# Patient Record
Sex: Male | Born: 1955 | Race: White | Hispanic: No | Marital: Married | Smoking: Never smoker
Health system: Southern US, Community
[De-identification: ages and names within clinical notes are randomized; demographics above are authoritative.]

## PROBLEM LIST (undated history)

## (undated) DIAGNOSIS — M25562 Pain in left knee: Secondary | ICD-10-CM

## (undated) DIAGNOSIS — M1990 Unspecified osteoarthritis, unspecified site: Secondary | ICD-10-CM

## (undated) DIAGNOSIS — R972 Elevated prostate specific antigen [PSA]: Secondary | ICD-10-CM

## (undated) HISTORY — PX: COLON SURGERY: SHX602

---

## 2016-01-23 ENCOUNTER — Ambulatory Visit
Admission: RE | Admit: 2016-01-23 | Discharge: 2016-01-23 | Disposition: A | Discharge status: Home / Self Care | Payer: Commercial Managed Care - HMO | Source: Ambulatory Visit | Attending: Family Medicine | Admitting: Family Medicine

## 2016-01-23 ENCOUNTER — Other Ambulatory Visit: Payer: Self-pay | Admitting: Family Medicine

## 2016-01-23 DIAGNOSIS — R52 Pain, unspecified: Secondary | ICD-10-CM

## 2017-07-30 ENCOUNTER — Encounter (HOSPITAL_BASED_OUTPATIENT_CLINIC_OR_DEPARTMENT_OTHER): Payer: Self-pay | Admitting: *Deleted

## 2017-07-30 ENCOUNTER — Other Ambulatory Visit: Payer: Self-pay | Admitting: Urology

## 2017-07-30 NOTE — Progress Notes (Signed)
NPO AFTER MN.  ARRIVE AT 0915.  NEEDS HG.  MAY TAKE TYLENOL IF NEEDED AM DOS W/ SIPS OF WATER.  WILL DO FLEET ENEMA AM DOS.

## 2017-08-02 ENCOUNTER — Other Ambulatory Visit: Payer: Self-pay | Admitting: Urology

## 2017-08-02 ENCOUNTER — Other Ambulatory Visit (HOSPITAL_COMMUNITY): Payer: Self-pay | Admitting: Urology

## 2017-08-02 DIAGNOSIS — R972 Elevated prostate specific antigen [PSA]: Secondary | ICD-10-CM

## 2017-08-02 DIAGNOSIS — C61 Malignant neoplasm of prostate: Secondary | ICD-10-CM

## 2017-08-03 MED ORDER — LACTATED RINGERS IV SOLN
INTRAVENOUS | Status: DC
  Filled 2017-08-03: qty 1000

## 2017-08-03 NOTE — H&P (Signed)
Urology Preoperative H&P   Chief Complain: elevated PSA and prostate nodule  History of Present Illness: Brett Lamb is a 61 y.o. male with an elevated PSA value of 5.2 as well as a prostate nodule on digital rectal exam.  He also has a prominent family history of prostate cancer diagnosed in his father.  An attempt to undergo a prostate biopsy was made in the office, but the patient became diaphoretic and had a near-syncopal episode after starting the prostate biopsy.  He denies a history of voiding or storage urinary symptoms, hematuria, UTIs, STDs, urolithiasis, GU malignancy/trauma/surgery.  Past Medical History:  Diagnosis Date  . Arthritis    feet  . Elevated PSA   . Left knee pain    injury     Past Surgical History:  Procedure Laterality Date  . COLON SURGERY  age 33 months   repair intestinal blockage    Allergies: No Known Allergies  History reviewed. No pertinent family history.  Social History:  reports that he has never smoked. He has never used smokeless tobacco. He reports that he does not drink alcohol or use drugs.  ROS: A complete review of systems was performed.  All systems are negative except for pertinent findings as noted.  Physical Exam:  Vital signs in last 24 hours:   Constitutional:  Alert and oriented, No acute distress Cardiovascular: Regular rate and rhythm, No JVD Respiratory: Normal respiratory effort, Lungs clear bilaterally GI: Abdomen is soft, nontender, nondistended, no abdominal masses GU: No CVA tenderness Lymphatic: No lymphadenopathy Neurologic: Grossly intact, no focal deficits Psychiatric: Normal mood and affect  Laboratory Data:  No results for input(s): WBC, HGB, HCT, PLT in the last 72 hours.  No results for input(s): NA, K, CL, GLUCOSE, BUN, CALCIUM, CREATININE in the last 72 hours.  Invalid input(s): CO3   No results found for this or any previous visit (from the past 24 hour(s)). No results found for this or any  previous visit (from the past 240 hour(s)).  Renal Function: No results for input(s): CREATININE in the last 168 hours. CrCl cannot be calculated (No order found.).  Radiologic Imaging: No results found.  I independently reviewed the above imaging studies.  Assessment and Plan Brett Lamb is a 61 y.o. male with an elevated PSA and a prostate nodule on digital rectal exam.  -The risks, benefits and alternatives of transrectal ultrasound guided prostate biopsy was discussed with the patient.  He voices understanding and wishes to proceed.    Conception Oms Vivien Barretto 08/03/2017, 8:25 AM    Annitta Jersey, MD

## 2017-08-04 ENCOUNTER — Encounter (HOSPITAL_BASED_OUTPATIENT_CLINIC_OR_DEPARTMENT_OTHER)
Admission: RE | Disposition: A | Discharge status: Home / Self Care | Payer: Self-pay | Source: Ambulatory Visit | Attending: Urology

## 2017-08-04 ENCOUNTER — Ambulatory Visit (HOSPITAL_COMMUNITY)
Admission: RE | Admit: 2017-08-04 | Discharge: 2017-08-04 | Disposition: A | Discharge status: Home / Self Care | Payer: 59 | Source: Ambulatory Visit | Attending: Urology | Admitting: Urology

## 2017-08-04 ENCOUNTER — Ambulatory Visit (HOSPITAL_BASED_OUTPATIENT_CLINIC_OR_DEPARTMENT_OTHER): Payer: 59 | Admitting: Anesthesiology

## 2017-08-04 ENCOUNTER — Ambulatory Visit (HOSPITAL_BASED_OUTPATIENT_CLINIC_OR_DEPARTMENT_OTHER)
Admission: RE | Admit: 2017-08-04 | Discharge: 2017-08-04 | Disposition: A | Discharge status: Home / Self Care | Payer: 59 | Source: Ambulatory Visit | Attending: Urology | Admitting: Urology

## 2017-08-04 ENCOUNTER — Encounter (HOSPITAL_BASED_OUTPATIENT_CLINIC_OR_DEPARTMENT_OTHER): Payer: Self-pay | Admitting: *Deleted

## 2017-08-04 DIAGNOSIS — Z125 Encounter for screening for malignant neoplasm of prostate: Secondary | ICD-10-CM

## 2017-08-04 DIAGNOSIS — Z8042 Family history of malignant neoplasm of prostate: Secondary | ICD-10-CM | POA: Insufficient documentation

## 2017-08-04 DIAGNOSIS — C61 Malignant neoplasm of prostate: Secondary | ICD-10-CM

## 2017-08-04 DIAGNOSIS — N402 Nodular prostate without lower urinary tract symptoms: Secondary | ICD-10-CM | POA: Diagnosis present

## 2017-08-04 HISTORY — DX: Elevated prostate specific antigen (PSA): R97.20

## 2017-08-04 HISTORY — DX: Pain in left knee: M25.562

## 2017-08-04 HISTORY — DX: Unspecified osteoarthritis, unspecified site: M19.90

## 2017-08-04 HISTORY — PX: PROSTATE BIOPSY: SHX241

## 2017-08-04 LAB — POCT I-STAT, CHEM 8
BUN: 14 mg/dL (ref 6–20)
CALCIUM ION: 1.21 mmol/L (ref 1.15–1.40)
CHLORIDE: 104 mmol/L (ref 101–111)
Creatinine, Ser: 1 mg/dL (ref 0.61–1.24)
Glucose, Bld: 101 mg/dL — ABNORMAL HIGH (ref 65–99)
HCT: 47 % (ref 39.0–52.0)
Hemoglobin: 16 g/dL (ref 13.0–17.0)
POTASSIUM: 3.7 mmol/L (ref 3.5–5.1)
SODIUM: 142 mmol/L (ref 135–145)
TCO2: 24 mmol/L (ref 22–32)

## 2017-08-04 SURGERY — BIOPSY, PROSTATE
Anesthesia: Monitor Anesthesia Care | Site: Prostate

## 2017-08-04 MED ORDER — PROPOFOL 500 MG/50ML IV EMUL
INTRAVENOUS | Status: DC | PRN
  Administered 2017-08-04: 200 ug/kg/min via INTRAVENOUS

## 2017-08-04 MED ORDER — MIDAZOLAM HCL 2 MG/2ML IJ SOLN
INTRAMUSCULAR | Status: AC
  Filled 2017-08-04: qty 2

## 2017-08-04 MED ORDER — SODIUM CHLORIDE 0.9 % IV SOLN
INTRAVENOUS | Status: DC
  Filled 2017-08-04: qty 1000

## 2017-08-04 MED ORDER — OXYCODONE HCL 5 MG PO TABS
5.0000 mg | ORAL_TABLET | Freq: Once | ORAL | Status: DC | PRN
  Filled 2017-08-04: qty 1

## 2017-08-04 MED ORDER — DEXTROSE 5 % IV SOLN
2.0000 g | Freq: Once | INTRAVENOUS | Status: DC
  Filled 2017-08-04: qty 2

## 2017-08-04 MED ORDER — PROPOFOL 10 MG/ML IV BOLUS
INTRAVENOUS | Status: AC
  Filled 2017-08-04: qty 20

## 2017-08-04 MED ORDER — MIDAZOLAM HCL 5 MG/5ML IJ SOLN
INTRAMUSCULAR | Status: DC | PRN
  Administered 2017-08-04: 2 mg via INTRAVENOUS

## 2017-08-04 MED ORDER — GENTAMICIN SULFATE 40 MG/ML IJ SOLN: 5.0000 mg/kg | INTRAVENOUS | Status: DC

## 2017-08-04 MED ORDER — CEFTRIAXONE SODIUM 2 G IJ SOLR
INTRAMUSCULAR | Status: AC
  Filled 2017-08-04: qty 2

## 2017-08-04 MED ORDER — SODIUM CHLORIDE 0.9 % IV SOLN
INTRAVENOUS | Status: DC
  Administered 2017-08-04: 13:00:00 via INTRAVENOUS
  Filled 2017-08-04: qty 1000

## 2017-08-04 MED ORDER — LIDOCAINE 2% (20 MG/ML) 5 ML SYRINGE
INTRAMUSCULAR | Status: DC | PRN
  Administered 2017-08-04: 50 mg via INTRAVENOUS

## 2017-08-04 MED ORDER — OXYCODONE HCL 5 MG/5ML PO SOLN
5.0000 mg | Freq: Once | ORAL | Status: DC | PRN
  Filled 2017-08-04: qty 5

## 2017-08-04 MED ORDER — PROMETHAZINE HCL 25 MG/ML IJ SOLN
6.2500 mg | INTRAMUSCULAR | Status: DC | PRN
  Filled 2017-08-04: qty 1

## 2017-08-04 MED ORDER — FENTANYL CITRATE (PF) 100 MCG/2ML IJ SOLN
25.0000 ug | INTRAMUSCULAR | Status: DC | PRN
  Filled 2017-08-04: qty 1

## 2017-08-04 MED ORDER — DEXTROSE 5 % IV SOLN
INTRAVENOUS | Status: AC
  Filled 2017-08-04: qty 50

## 2017-08-04 MED ORDER — GENTAMICIN SULFATE 40 MG/ML IJ SOLN
5.0000 mg/kg | INTRAVENOUS | Status: AC
  Administered 2017-08-04: 450 mg via INTRAVENOUS
  Filled 2017-08-04: qty 11.25

## 2017-08-04 MED ORDER — MEPERIDINE HCL 25 MG/ML IJ SOLN
6.2500 mg | INTRAMUSCULAR | Status: DC | PRN
  Filled 2017-08-04: qty 1

## 2017-08-04 MED ORDER — FENTANYL CITRATE (PF) 100 MCG/2ML IJ SOLN
INTRAMUSCULAR | Status: DC | PRN
  Administered 2017-08-04: 50 ug via INTRAVENOUS

## 2017-08-04 MED ORDER — FENTANYL CITRATE (PF) 100 MCG/2ML IJ SOLN
INTRAMUSCULAR | Status: AC
  Filled 2017-08-04: qty 2

## 2017-08-04 SURGICAL SUPPLY — 12 items
CLOTH BEACON ORANGE TIMEOUT ST (SAFETY) ×2 IMPLANT
INST BIOPSY MAXCORE 18GX25 (NEEDLE) ×1 IMPLANT
INSTR BIOPSY MAXCORE 18GX20 (NEEDLE) IMPLANT
KIT RM TURNOVER CYSTO AR (KITS) ×2 IMPLANT
NDL SAFETY ECLIPSE 18X1.5 (NEEDLE) IMPLANT
NDL SPNL 22GX7 QUINCKE BK (NEEDLE) ×1 IMPLANT
NEEDLE HYPO 18GX1.5 SHARP (NEEDLE)
NEEDLE SPNL 22GX7 QUINCKE BK (NEEDLE) ×2 IMPLANT
NS IRRIG 500ML POUR BTL (IV SOLUTION) ×2 IMPLANT
PAD PREP 24X48 CUFFED NSTRL (MISCELLANEOUS) ×2 IMPLANT
SYR CONTROL 10ML LL (SYRINGE) IMPLANT
UNDERPAD 30X30 INCONTINENT (UNDERPADS AND DIAPERS) ×4 IMPLANT

## 2017-08-04 NOTE — Progress Notes (Signed)
Patient ate breakfast..  Surgery cancelled to be reschedule

## 2017-08-04 NOTE — Interval H&P Note (Signed)
History and Physical Interval Note:  08/04/2017 2:11 PM  Brett Lamb  has presented today for surgery, with the diagnosis of ELEVATED PROSTATE- SPECIFIC ANTIGEN  The various methods of treatment have been discussed with the patient and family. After consideration of risks, benefits and other options for treatment, the patient has consented to  Procedure(s) with comments: PROSTATE BIOPSY (N/A) - NEEDS 30 MIN as a surgical intervention .  The patient's history has been reviewed, patient examined, no change in status, stable for surgery.  I have reviewed the patient's chart and labs.  Questions were answered to the patient's satisfaction.     [name]

## 2017-08-04 NOTE — Anesthesia Procedure Notes (Signed)
Procedure Name: MAC Date/Time: 08/04/2017 2:20 PM Performed by: Wanita Chamberlain Pre-anesthesia Checklist: Patient identified, Timeout performed, Emergency Drugs available, Suction available and Patient being monitored Patient Re-evaluated:Patient Re-evaluated prior to induction Oxygen Delivery Method: Simple face mask Preoxygenation: Pre-oxygenation with 100% oxygen Induction Type: IV induction Placement Confirmation: positive ETCO2 and CO2 detector Dental Injury: Teeth and Oropharynx as per pre-operative assessment

## 2017-08-04 NOTE — Op Note (Signed)
Preoperative diagnosis:  1. Elevated PSA with prostate nodule  Postoperative diagnosis: 1. Elevated PSA with prostate nodule  Procedure(s): 1. Transrectal ultrasound-guided prostate biopsy  Surgeon: Brett Hughs, MD  Anesthesia:  Local/MAC  Complications: none  EBL: less than 5 mL  Specimens: 1. 2 individual prostate biopsies from each sextant  Disposition of specimens: pathology  Intraoperative findings: prostate volume = 29 g  Indication: Brett Lamb is a 61 year old male he was recently found to have an elevated PSA value of 5.2 ng/dL and a prostate nodule on physical exam.  He has been consented for the above procedures, voices understanding and wishes to proceed.  Description of procedure:  After informed consent was obtained, the patient was brought to the operating room and local/Mac anesthesia was administered.  A time out was then performed.  The ultrasound probe was then inserted into the rectum and visualization of the prostate was performed.  Images of the prostate base, mid-prostate and prostate apex were then obtained.  The prostate volume was measured at approximately 29 g.  We then obtained lateral and medial biopsies from each sextant of the prostate for a total of 12 biopsies.  The ultrasound probe was then removed and the specimens were sent to pathology for analysis.  The patient tolerated the procedure well and was transferred to the postanesthesia unit in stable condition.  Plan: Follow up with Dr. Noah Delaine to discuss pathology results in the next 7-10 days.

## 2017-08-04 NOTE — Transfer of Care (Signed)
Immediate Anesthesia Transfer of Care Note  Patient: Brett Lamb  Procedure(s) Performed: Procedure(s) with comments: PROSTATE BIOPSY (N/A) - NEEDS 30 MIN  Patient Location: PACU  Anesthesia Type:MAC  Level of Consciousness: awake, alert , oriented and patient cooperative  Airway & Oxygen Therapy: Patient Spontanous Breathing and Patient connected to face mask oxygen  Post-op Assessment: Report given to RN and Post -op Vital signs reviewed and stable  Post vital signs: Reviewed and stable  Last Vitals:  Vitals:   08/04/17 0856 08/04/17 1220  BP: (!) 165/77 (!) 158/76  Pulse: 69 63  Resp: 16 18  Temp: 36.9 C 36.9 C  SpO2: 99% 99%    Last Pain:  Vitals:   08/04/17 1220  TempSrc: Oral  PainSc:       Patients Stated Pain Goal: 8 (62/[id number])  Complications: No apparent anesthesia complications

## 2017-08-04 NOTE — Anesthesia Preprocedure Evaluation (Signed)
Anesthesia Evaluation  Patient identified by MRN, date of birth, ID band Patient awake    Reviewed: Allergy & Precautions, NPO status , Patient's Chart, lab work & pertinent test results  Airway Mallampati: II  TM Distance: >3 FB Neck ROM: Full    Dental no notable dental hx.    Pulmonary neg pulmonary ROS,    Pulmonary exam normal breath sounds clear to auscultation       Cardiovascular negative cardio ROS Normal cardiovascular exam Rhythm:Regular Rate:Normal     Neuro/Psych negative neurological ROS  negative psych ROS   GI/Hepatic negative GI ROS, Neg liver ROS,   Endo/Other  negative endocrine ROS  Renal/GU negative Renal ROS     Musculoskeletal  (+) Arthritis ,   Abdominal   Peds  Hematology negative hematology ROS (+)   Anesthesia Other Findings   Reproductive/Obstetrics negative OB ROS                             Anesthesia Physical Anesthesia Plan  ASA: II  Anesthesia Plan: MAC   Post-op Pain Management:    Induction: Intravenous  PONV Risk Score and Plan: 1 and Ondansetron, Midazolam and Propofol infusion  Airway Management Planned:   Additional Equipment:   Intra-op Plan:   Post-operative Plan:   Informed Consent: I have reviewed the patients History and Physical, chart, labs and discussed the procedure including the risks, benefits and alternatives for the proposed anesthesia with the patient or authorized representative who has indicated his/her understanding and acceptance.   Dental advisory given  Plan Discussed with: CRNA  Anesthesia Plan Comments:         Anesthesia Quick Evaluation

## 2017-08-04 NOTE — Anesthesia Postprocedure Evaluation (Signed)
Anesthesia Post Note  Patient: Brett Lamb  Procedure(s) Performed: Procedure(s) (LRB): PROSTATE BIOPSY (N/A)     Patient location during evaluation: PACU Anesthesia Type: MAC Level of consciousness: awake and alert Pain management: pain level controlled Vital Signs Assessment: post-procedure vital signs reviewed and stable Respiratory status: spontaneous breathing, nonlabored ventilation, respiratory function stable and patient connected to nasal cannula oxygen Cardiovascular status: stable and blood pressure returned to baseline Anesthetic complications: no    Last Vitals:  Vitals:   08/04/17 1500 08/04/17 1515  BP: 137/77 134/70  Pulse: 64 63  Resp: 15 13  Temp:    SpO2: 95% 92%    Last Pain:  Vitals:   08/04/17 1515  TempSrc:   PainSc: 0-No pain                 Ryan P Ellender

## 2017-08-04 NOTE — Discharge Instructions (Signed)
Needle Biopsy, Care After These instructions give you information about caring for yourself after your procedure. Your doctor may also give you more specific instructions. Call your doctor if you have any problems or questions after your procedure. Follow these instructions at home:  Rest as told by your doctor.  Take medicines only as told by your doctor.  There are many different ways to close and cover the biopsy site, including stitches (sutures), skin glue, and adhesive strips. Follow instructions from your doctor about: ? How to take care of your biopsy site. ? When and how you should change your bandage (dressing). ? When you should remove your dressing. ? Removing whatever was used to close your biopsy site.  Check your biopsy site every day for signs of infection. Watch for: ? Redness, swelling, or pain. ? Fluid, blood, or pus. Contact a doctor if:  You have a fever.  You have redness, swelling, or pain at the biopsy site, and it lasts longer than a few days.  You have fluid, blood, or pus coming from the biopsy site.  You feel sick to your stomach (nauseous).  You throw up (vomit). Get help right away if:  You are short of breath.  You have trouble breathing.  Your chest hurts.  You feel dizzy or you pass out (faint).  You have bleeding that does not stop with pressure or a bandage.  You cough up blood.  Your belly (abdomen) hurts. This information is not intended to replace advice given to you by your health care provider. Make sure you discuss any questions you have with your health care provider. Document Released: 11/05/2008 Document Revised: 04/30/2016 Document Reviewed: 11/19/2014 Elsevier Interactive Patient Education  2018 [place] [facility] Care Instructions  Activity: Get plenty of rest for the remainder of the day. A responsible individual must stay with you for 24 hours following the procedure.  For the next 24 hours, DO  NOT: -Drive a car -Paediatric nurse -Drink alcoholic beverages -Take any medication unless instructed by your physician -Make any legal decisions or sign important papers.  Meals: Start with liquid foods such as gelatin or soup. Progress to regular foods as tolerated. Avoid greasy, spicy, heavy foods. If nausea and/or vomiting occur, drink only clear liquids until the nausea and/or vomiting subsides. Call your physician if vomiting continues.  Special Instructions/Symptoms: Your throat may feel dry or sore from the anesthesia or the breathing tube placed in your throat during surgery. If this causes discomfort, gargle with warm salt water. The discomfort should disappear within 24 hours.  If you had a scopolamine patch placed behind your ear for the management of post- operative nausea and/or vomiting:  1. The medication in the patch is effective for 72 hours, after which it should be removed.  Wrap patch in a tissue and discard in the trash. Wash hands thoroughly with soap and water. 2. You may remove the patch earlier than 72 hours if you experience unpleasant side effects which may include dry mouth, dizziness or visual disturbances. 3. Avoid touching the patch. Wash your hands with soap and water after contact with the patch.

## 2017-08-04 NOTE — Progress Notes (Signed)
PT RESCHEDULED FOR 1500 TODAY, PER DR WINTER HE IS ABLE TO DO THIS AND THERE WAS TIME ABLE ON SCHEDULE OK BY OR SUPERVISER. PT WAS GIVEN INSTRUCTIONS BY DR WINTER CAN LEAVE FACILITY BUT NOT TO EAT OR DRING ANYTHING BY MOUTH.  PT TO RETURN AT 1400.

## 2017-08-05 ENCOUNTER — Encounter (HOSPITAL_BASED_OUTPATIENT_CLINIC_OR_DEPARTMENT_OTHER): Payer: Self-pay | Admitting: Urology

## 2017-08-12 ENCOUNTER — Encounter: Payer: Self-pay | Admitting: Radiation Oncology

## 2017-08-12 ENCOUNTER — Other Ambulatory Visit: Payer: Self-pay | Admitting: Urology

## 2017-08-12 DIAGNOSIS — C61 Malignant neoplasm of prostate: Secondary | ICD-10-CM

## 2017-08-19 ENCOUNTER — Encounter (HOSPITAL_COMMUNITY)
Admission: RE | Admit: 2017-08-19 | Discharge: 2017-08-19 | Disposition: A | Discharge status: Home / Self Care | Payer: 59 | Source: Ambulatory Visit | Attending: Urology | Admitting: Urology

## 2017-08-19 DIAGNOSIS — C61 Malignant neoplasm of prostate: Secondary | ICD-10-CM | POA: Diagnosis present

## 2017-08-19 MED ORDER — TECHNETIUM TC 99M MEDRONATE IV KIT
21.4000 | PACK | Freq: Once | INTRAVENOUS | Status: AC | PRN
  Administered 2017-08-19: 21.4 via INTRAVENOUS

## 2017-08-24 ENCOUNTER — Ambulatory Visit (HOSPITAL_COMMUNITY): Payer: 59

## 2017-08-24 ENCOUNTER — Other Ambulatory Visit (HOSPITAL_COMMUNITY): Payer: 59

## 2017-08-26 ENCOUNTER — Ambulatory Visit: Payer: 59

## 2017-08-26 ENCOUNTER — Ambulatory Visit: Payer: 59 | Admitting: Radiation Oncology

## 2017-09-09 ENCOUNTER — Telehealth: Payer: Self-pay | Admitting: Medical Oncology

## 2017-09-09 NOTE — Telephone Encounter (Signed)
I spoke with Brett Lamb to follow up after cancelling his consult with Dr. Tammi Klippel 08/26/17. He states that he changed from [facility] to [facility] in [facility]. He is scheduled for robotic prostatectomy 10/12/17. I stressed that the importance of treating his cancer and wished him well. He thanked me for following up and I asked him to call if we can assist him going forward. He voiced understanding.

## 2017-09-27 ENCOUNTER — Telehealth: Payer: Self-pay

## 2017-10-06 NOTE — Telephone Encounter (Signed)
error 

## 2017-11-11 IMAGING — NM NM BONE WHOLE BODY
2 series · 2 of 2 positions shown · non-contrast
Comparison: None.

CLINICAL DATA: Prostate cancer.

EXAM:
NUCLEAR MEDICINE WHOLE BODY BONE SCAN
TECHNIQUE: Whole body anterior and posterior images were obtained approximately
3 hours after intravenous injection of radiopharmaceutical.
RADIOPHARMACEUTICALS:  21.4 mCi 0echnetium-WWm MDP IV

[Series 1: whole body · 2.66mm/px · 1 of 1 slices shown (1 of 2)]
[im 1/1]
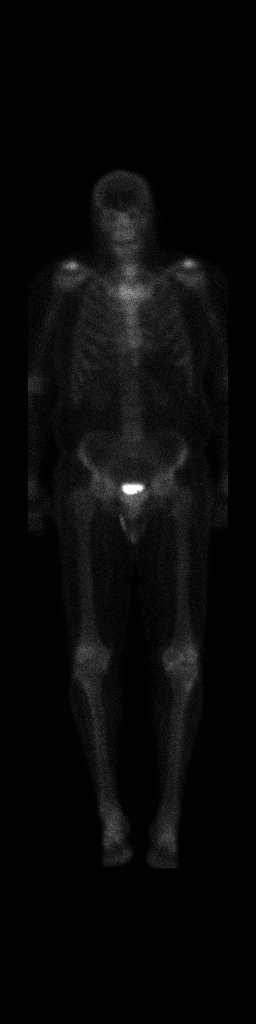

[Series 1: whole body · 2.66mm/px · 1 of 1 slices shown (2 of 2)]
[im 1/1]
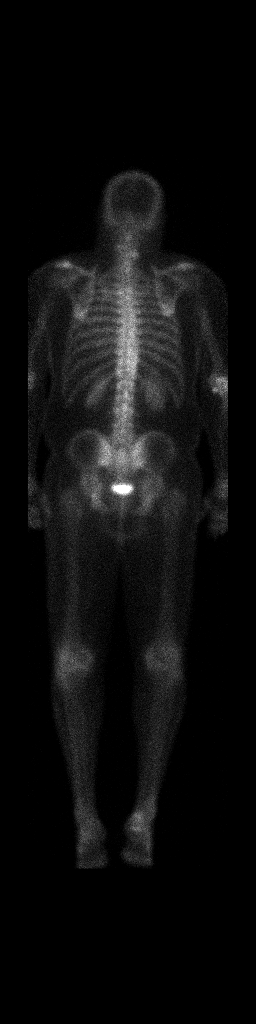

[2 of 2 positions shown; findings below may reference images not displayed]

FINDINGS: No metastatic pattern. Spine and appendicular joint activity best
attributed to degeneration. Normal background with bilateral renal
and bladder activity seen.
IMPRESSION: Negative for metastatic pattern.

## 2024-12-21 ENCOUNTER — Other Ambulatory Visit: Payer: Self-pay

## 2024-12-21 ENCOUNTER — Inpatient Hospital Stay (HOSPITAL_COMMUNITY)
Admission: EM | Admit: 2024-12-21 | Discharge: 2024-12-24 | DRG: 270 | Disposition: A | Discharge status: Home / Self Care | Attending: Cardiology | Admitting: Cardiology

## 2024-12-21 DIAGNOSIS — Z8546 Personal history of malignant neoplasm of prostate: Secondary | ICD-10-CM

## 2024-12-21 DIAGNOSIS — I469 Cardiac arrest, cause unspecified: Secondary | ICD-10-CM | POA: Diagnosis present

## 2024-12-21 DIAGNOSIS — I255 Ischemic cardiomyopathy: Secondary | ICD-10-CM | POA: Diagnosis present

## 2024-12-21 DIAGNOSIS — Z86711 Personal history of pulmonary embolism: Secondary | ICD-10-CM

## 2024-12-21 DIAGNOSIS — Z9079 Acquired absence of other genital organ(s): Secondary | ICD-10-CM

## 2024-12-21 DIAGNOSIS — E7800 Pure hypercholesterolemia, unspecified: Secondary | ICD-10-CM | POA: Diagnosis present

## 2024-12-21 DIAGNOSIS — I213 ST elevation (STEMI) myocardial infarction of unspecified site: Principal | ICD-10-CM

## 2024-12-21 DIAGNOSIS — Z955 Presence of coronary angioplasty implant and graft: Secondary | ICD-10-CM

## 2024-12-21 DIAGNOSIS — I5020 Unspecified systolic (congestive) heart failure: Secondary | ICD-10-CM | POA: Diagnosis present

## 2024-12-21 DIAGNOSIS — I4901 Ventricular fibrillation: Secondary | ICD-10-CM | POA: Diagnosis present

## 2024-12-21 DIAGNOSIS — I5021 Acute systolic (congestive) heart failure: Secondary | ICD-10-CM | POA: Diagnosis present

## 2024-12-21 DIAGNOSIS — I472 Ventricular tachycardia, unspecified: Secondary | ICD-10-CM | POA: Diagnosis present

## 2024-12-21 DIAGNOSIS — I2510 Atherosclerotic heart disease of native coronary artery without angina pectoris: Secondary | ICD-10-CM | POA: Diagnosis present

## 2024-12-21 DIAGNOSIS — I493 Ventricular premature depolarization: Secondary | ICD-10-CM | POA: Diagnosis present

## 2024-12-21 DIAGNOSIS — Z79899 Other long term (current) drug therapy: Secondary | ICD-10-CM

## 2024-12-21 DIAGNOSIS — I462 Cardiac arrest due to underlying cardiac condition: Secondary | ICD-10-CM | POA: Diagnosis present

## 2024-12-21 DIAGNOSIS — R739 Hyperglycemia, unspecified: Secondary | ICD-10-CM | POA: Diagnosis present

## 2024-12-21 DIAGNOSIS — Z923 Personal history of irradiation: Secondary | ICD-10-CM

## 2024-12-21 DIAGNOSIS — I2109 ST elevation (STEMI) myocardial infarction involving other coronary artery of anterior wall: Principal | ICD-10-CM | POA: Diagnosis present

## 2024-12-21 MED ORDER — FENTANYL CITRATE (PF) 50 MCG/ML IJ SOSY
25.0000 ug | PREFILLED_SYRINGE | Freq: Once | INTRAMUSCULAR | Status: AC
  Administered 2024-12-21: 25 ug via INTRAVENOUS

## 2024-12-21 MED ORDER — SODIUM CHLORIDE 0.9 % IV SOLN: INTRAVENOUS | Status: AC

## 2024-12-21 MED ORDER — FENTANYL CITRATE (PF) 50 MCG/ML IJ SOSY
PREFILLED_SYRINGE | INTRAMUSCULAR | Status: AC
  Filled 2024-12-21: qty 1

## 2024-12-21 MED ORDER — HEPARIN SODIUM (PORCINE) 5000 UNIT/ML IJ SOLN
4000.0000 [IU] | Freq: Once | INTRAMUSCULAR | Status: AC
  Administered 2024-12-21: 4000 [IU] via INTRAVENOUS
  Filled 2024-12-21: qty 1

## 2024-12-21 NOTE — ED Provider Notes (Signed)
 " [place] EMERGENC[facility] DE[facility]MENT AT [facility] Provider Note   CSN: [id number] Arrival date & time: 12/21/24  2341     Patient presents with: Code STEMI  LEVEL 5 CAVEAT DUE TO ACUITY OF CONDITION Brett Lamb is a 69 y.o. male.   The history is provided by the patient, the EMS personnel and the spouse.   Patient with history of prostate cancer, previous history of preoperative pulmonary embolism in 2018 presents with chest pain and pressure followed by cardiac arrest Patient reports around 10:15 PM he started having chest pressure and took a Tums with no relief.  EMS was called, on their arrival patient was in ventricular tachycardia and then went into cardiac arrest.  CPR was initiated, patient received CPR for around 2 minutes, was defibrillated back into sinus rhythm. Patient was given aspirin , nitroglycerin, and amiodarone by EMS. Patient is now awake and alert. He has never had this happen before    Prior to Admission medications  Medication Sig Start Date End Date Taking? Authorizing Provider  acetaminophen  (TYLENOL ) 500 MG tablet Take 1,000 mg by mouth every 6 (six) hours as needed.    [provider]  Calcium -Magnesium -Zinc (CAL-MAG-ZINC PO) Take by mouth daily.    [provider]  Multiple Vitamin (MULTIVITAMIN) tablet Take 1 tablet by mouth daily.    [provider]  vitamin E 400 UNIT capsule Take 400 Units by mouth daily.    [provider]    Allergies: Patient has no known allergies.    Review of Systems  Unable to perform ROS: Acuity of condition  Gastrointestinal:  Negative for blood in stool.  Genitourinary:  Negative for hematuria.    Updated Vital Signs BP 109/77   Pulse (!) 47   Resp (!) 9   Ht 1.854 m (6' 1)   Wt 102.1 kg   SpO2 97%   BMI 29.69 kg/m   Physical Exam CONSTITUTIONAL: Ill appearing HEAD: Normocephalic/atraumatic ENMT: Mucous membranes moist NECK: supple no meningeal signs CV: S1/S2  noted, no murmurs/rubs/gallops noted LUNGS: Lungs are clear to auscultation bilaterally, no apparent distress ABDOMEN: soft, nontender NEURO: Pt is awake/alert/appropriate, moves all extremitiesx4.  No facial droop.   EXTREMITIES: pulses normal/equalx4, full ROM SKIN: warm, color normal  (all labs ordered are listed, but only abnormal results are displayed) Labs Reviewed  I-STAT CG4 LACTIC ACID, ED - Abnormal; Notable for the following components:      Result Value   Lactic Acid, Venous 2.1 (*)    All other components within normal limits  I-STAT CHEM 8, ED - Abnormal; Notable for the following components:   Potassium 3.2 (*)    Glucose, Bld 154 (*)    Calcium , Ion 1.05 (*)    All other components within normal limits  HEMOGLOBIN A1C  CBC WITH DIFFERENTIAL/PLATELET  PROTIME-INR  APTT  COMPREHENSIVE METABOLIC PANEL WITH GFR  LIPID PANEL  MAGNESIUM   TROPONIN T, HIGH SENSITIVITY    EKG: EKG Interpretation Date/Time:  Thursday December 21 2024 23:46:30 EST Ventricular Rate:  51 PR Interval:  201 QRS Duration:  106 QT Interval:  469 QTC Calculation: 432 R Axis:   -32  Text Interpretation: Sinus rhythm Multiple ventricular premature complexes Left axis deviation Low voltage, precordial leads Abnormal R-wave progression, early transition Repol abnrm suggests ischemia, lateral leads Confirmed by Midge Golas ([id number]) on 12/21/2024 11:48:27 PM  Radiology: [facility] Chest Port 1 View Result Date: 12/22/2024 EXAM: 1 VIEW XRAY OF THE CHEST 12/22/2024 12:09:00 AM COMPARISON:  Comparison exam post 08/19/2017. CLINICAL HISTORY: chest pain FINDINGS: LUNGS AND PLEURA: Low lung volumes. Possible left base airspace opacity or pleural effusion. No pneumothorax. HEART AND MEDIASTINUM: No acute abnormality of the cardiac and mediastinal silhouettes. BONES AND SOFT TISSUES: No acute osseous abnormality. IMPRESSION: 1. low lung volumes. Possible left base airspace opacity or pleural effusion. Recommend  repeat PA and lateral view chest with improvement inspiratory effort. Electronically signed by: Morgane Naveau MD 12/22/2024 12:12 AM EST RP Workstation: [id number]     .Critical Care  Performed by: Midge Golas, MD Authorized by: Midge Golas, MD   Critical care provider statement:    Critical care time (minutes):  55   Critical care start time:  12/21/2024 11:20 PM   Critical care end time:  12/22/2024 12:15 AM   Critical care time was exclusive of:  Separately billable procedures and treating other patients   Critical care was necessary to treat or prevent imminent or life-threatening deterioration of the following conditions:  Cardiac failure and circulatory failure   Critical care was time spent personally by me on the following activities:  Obtaining history from patient or surrogate, examination of patient, development of treatment plan with patient or surrogate, pulse oximetry, re-evaluation of patient's condition, review of old charts, ordering and performing treatments and interventions, discussions with consultants, evaluation of patient's response to treatment, ordering and review of laboratory studies and ordering and review of radiographic studies   I assumed direction of critical care for this patient from another provider in my specialty: no     Care discussed with: admitting provider      Medications Ordered in the ED  0.9 %  sodium chloride  infusion ( Intravenous New Bag/Given 12/21/24 2356)  heparin  injection 4,000 Units (4,000 Units Intravenous Given 12/21/24 2355)  fentaNYL  (SUBLIMAZE ) injection 25 mcg (25 mcg Intravenous Given 12/21/24 2356)  fentaNYL  (SUBLIMAZE ) injection 50 mcg (50 mcg Intravenous Given 12/22/24 0015)    Clinical Course as of 12/22/24 0018  Thu Dec 21, 2024  2353 D/w dr ladona with cardiology He will take to cath lab [DW]  Fri Dec 22, 2024  0006 Pt currently stable, reports CP mildly improved  [DW]  0007 Glucose(!): 154 Mild hyperglycemia  [DW]  0013 Dr ladona at bedside Patient reports CP is improving  [DW]    Clinical Course User Index [DW] Midge Golas, MD                                 Medical Decision Making Amount and/or Complexity of Data Reviewed Labs: ordered. Decision-making details documented in ED Course. Radiology: ordered.  Risk Prescription drug management. Decision regarding hospitalization.   This patient presents to the ED for concern of chest pain, this involves an extensive number of treatment options, and is a complaint that carries with it a high risk of complications and morbidity.  The differential diagnosis includes but is not limited to acute coronary syndrome, aortic dissection, pulmonary embolism, pericarditis, pneumothorax, pneumonia, myocarditis, pleurisy, esophageal rupture    Comorbidities that complicate the patient evaluation: Patients presentation is complicated by their history of prostate cancer  Additional history obtained: Additional history obtained from spouse and EMS   Lab Tests: I Ordered, and personally interpreted labs.  The pertinent results include: Hyperglycemia  Imaging Studies ordered: I ordered imaging studies including X-ray chest  I have reviewed the x-ray, no acute finding  Cardiac Monitoring: The patient was maintained on a  cardiac monitor.  I personally viewed and interpreted the cardiac monitor which showed an underlying rhythm of:  sinus bradycardia  Medicines ordered and prescription drug management: I ordered medication including heparin  and fentanyl  for pain Reevaluation of the patient after these medicines showed that the patient    improved   Critical Interventions:   heparin , activated cardiac Cath Lab, admitted to Cath Lab  Consultations Obtained: I requested consultation with the consultant cardiology, and discussed  findings as well as pertinent plan - they recommend: Take to Cath Lab  Reevaluation: After the interventions noted  above, I reevaluated the patient and found that they have :improved  Complexity of problems addressed: Patients presentation is most consistent with  acute presentation with potential threat to life or bodily function  Disposition: After consideration of the diagnostic results and the patients response to treatment,  I feel that the patent would benefit from admission  .   Prehospital rhythm strip reveals polymorphic V. tach Initial EMS EKG did not reveal STEMI after ROSC Just prior to arrival repeat EKG revealed ST elevation in the anterior leads.  Upon arrival to the ER, I initiated a code STEMI due to EMS EKG prior to arrival. Repeat EKG on arrival does reveal elevation with reciprocal changes. Patient stabilized in the Emergency Department and admitted emergently to the cardiac Cath Lab    Final diagnoses:  ST elevation myocardial infarction (STEMI), unspecified artery [facility])    ED Discharge Orders     None          Midge Golas, MD 12/22/24 0018  "

## 2024-12-22 ENCOUNTER — Encounter (HOSPITAL_COMMUNITY)
Admission: EM | Disposition: A | Discharge status: Home / Self Care | Payer: Self-pay | Source: Home / Self Care | Attending: Cardiology

## 2024-12-22 ENCOUNTER — Other Ambulatory Visit (HOSPITAL_COMMUNITY): Payer: Self-pay

## 2024-12-22 ENCOUNTER — Inpatient Hospital Stay (HOSPITAL_COMMUNITY)

## 2024-12-22 ENCOUNTER — Emergency Department (HOSPITAL_COMMUNITY)

## 2024-12-22 ENCOUNTER — Telehealth (HOSPITAL_COMMUNITY): Payer: Self-pay

## 2024-12-22 DIAGNOSIS — I462 Cardiac arrest due to underlying cardiac condition: Secondary | ICD-10-CM | POA: Diagnosis present

## 2024-12-22 DIAGNOSIS — Z79899 Other long term (current) drug therapy: Secondary | ICD-10-CM | POA: Diagnosis not present

## 2024-12-22 DIAGNOSIS — I2109 ST elevation (STEMI) myocardial infarction involving other coronary artery of anterior wall: Principal | ICD-10-CM

## 2024-12-22 DIAGNOSIS — Z86711 Personal history of pulmonary embolism: Secondary | ICD-10-CM | POA: Diagnosis not present

## 2024-12-22 DIAGNOSIS — I493 Ventricular premature depolarization: Secondary | ICD-10-CM | POA: Diagnosis present

## 2024-12-22 DIAGNOSIS — I472 Ventricular tachycardia, unspecified: Secondary | ICD-10-CM | POA: Diagnosis present

## 2024-12-22 DIAGNOSIS — I255 Ischemic cardiomyopathy: Secondary | ICD-10-CM | POA: Diagnosis present

## 2024-12-22 DIAGNOSIS — I2510 Atherosclerotic heart disease of native coronary artery without angina pectoris: Secondary | ICD-10-CM

## 2024-12-22 DIAGNOSIS — R739 Hyperglycemia, unspecified: Secondary | ICD-10-CM | POA: Diagnosis present

## 2024-12-22 DIAGNOSIS — Z923 Personal history of irradiation: Secondary | ICD-10-CM | POA: Diagnosis not present

## 2024-12-22 DIAGNOSIS — I5021 Acute systolic (congestive) heart failure: Secondary | ICD-10-CM | POA: Diagnosis present

## 2024-12-22 DIAGNOSIS — I4901 Ventricular fibrillation: Secondary | ICD-10-CM | POA: Diagnosis present

## 2024-12-22 DIAGNOSIS — Z9079 Acquired absence of other genital organ(s): Secondary | ICD-10-CM | POA: Diagnosis not present

## 2024-12-22 DIAGNOSIS — Z8546 Personal history of malignant neoplasm of prostate: Secondary | ICD-10-CM | POA: Diagnosis not present

## 2024-12-22 DIAGNOSIS — E7800 Pure hypercholesterolemia, unspecified: Secondary | ICD-10-CM | POA: Diagnosis present

## 2024-12-22 HISTORY — PX: RIGHT HEART CATH: CATH118263

## 2024-12-22 HISTORY — PX: CORONARY/GRAFT ACUTE MI REVASCULARIZATION: CATH118305

## 2024-12-22 HISTORY — PX: IABP INSERTION: CATH118242

## 2024-12-22 HISTORY — PX: LEFT HEART CATH AND CORONARY ANGIOGRAPHY: CATH118249

## 2024-12-22 LAB — CBC WITH DIFFERENTIAL/PLATELET
Abs Immature Granulocytes: 0.06 K/uL (ref 0.00–0.07)
Basophils Absolute: 0.1 K/uL (ref 0.0–0.1)
Basophils Relative: 1 %
Eosinophils Absolute: 0.3 K/uL (ref 0.0–0.5)
Eosinophils Relative: 3 %
HCT: 42.7 % (ref 39.0–52.0)
Hemoglobin: 14.6 g/dL (ref 13.0–17.0)
Immature Granulocytes: 1 %
Lymphocytes Relative: 31 %
Lymphs Abs: 3.4 K/uL (ref 0.7–4.0)
MCH: 32.3 pg (ref 26.0–34.0)
MCHC: 34.2 g/dL (ref 30.0–36.0)
MCV: 94.5 fL (ref 80.0–100.0)
Monocytes Absolute: 1 K/uL (ref 0.1–1.0)
Monocytes Relative: 9 %
Neutro Abs: 6.1 K/uL (ref 1.7–7.7)
Neutrophils Relative %: 55 %
Platelets: 262 K/uL (ref 150–400)
RBC: 4.52 MIL/uL (ref 4.22–5.81)
RDW: 13.2 % (ref 11.5–15.5)
WBC: 10.9 K/uL — ABNORMAL HIGH (ref 4.0–10.5)
nRBC: 0 % (ref 0.0–0.2)

## 2024-12-22 LAB — POCT I-STAT EG7
Acid-base deficit: 1 mmol/L (ref 0.0–2.0)
Bicarbonate: 25.2 mmol/L (ref 20.0–28.0)
Calcium, Ion: 1.14 mmol/L — ABNORMAL LOW (ref 1.15–1.40)
HCT: 40 % (ref 39.0–52.0)
Hemoglobin: 13.6 g/dL (ref 13.0–17.0)
O2 Saturation: 63 %
Potassium: 3.7 mmol/L (ref 3.5–5.1)
Sodium: 144 mmol/L (ref 135–145)
TCO2: 27 mmol/L (ref 22–32)
pCO2, Ven: 46.7 mmHg (ref 44–60)
pH, Ven: 7.34 (ref 7.25–7.43)
pO2, Ven: 35 mmHg (ref 32–45)

## 2024-12-22 LAB — CBC
HCT: 39.4 % (ref 39.0–52.0)
Hemoglobin: 13.7 g/dL (ref 13.0–17.0)
MCH: 32.5 pg (ref 26.0–34.0)
MCHC: 34.8 g/dL (ref 30.0–36.0)
MCV: 93.4 fL (ref 80.0–100.0)
Platelets: 208 K/uL (ref 150–400)
RBC: 4.22 MIL/uL (ref 4.22–5.81)
RDW: 13.2 % (ref 11.5–15.5)
WBC: 11 K/uL — ABNORMAL HIGH (ref 4.0–10.5)
nRBC: 0 % (ref 0.0–0.2)

## 2024-12-22 LAB — ECHOCARDIOGRAM COMPLETE
Area-P 1/2: 3.6 cm2
Height: 73 in
S' Lateral: 2.7 cm
Single Plane A2C EF: 70.7 %
Weight: 3689.62 [oz_av]

## 2024-12-22 LAB — I-STAT CHEM 8, ED
BUN: 18 mg/dL (ref 8–23)
Calcium, Ion: 1.05 mmol/L — ABNORMAL LOW (ref 1.15–1.40)
Chloride: 106 mmol/L (ref 98–111)
Creatinine, Ser: 1.2 mg/dL (ref 0.61–1.24)
Glucose, Bld: 154 mg/dL — ABNORMAL HIGH (ref 70–99)
HCT: 42 % (ref 39.0–52.0)
Hemoglobin: 14.3 g/dL (ref 13.0–17.0)
Potassium: 3.2 mmol/L — ABNORMAL LOW (ref 3.5–5.1)
Sodium: 144 mmol/L (ref 135–145)
TCO2: 22 mmol/L (ref 22–32)

## 2024-12-22 LAB — TROPONIN T, HIGH SENSITIVITY
Troponin T High Sensitivity: 724 ng/L (ref 0–19)
Troponin T High Sensitivity: 79 ng/L — ABNORMAL HIGH (ref 0–19)

## 2024-12-22 LAB — BASIC METABOLIC PANEL WITH GFR
Anion gap: 11 (ref 5–15)
BUN: 16 mg/dL (ref 8–23)
CO2: 23 mmol/L (ref 22–32)
Calcium: 8.8 mg/dL — ABNORMAL LOW (ref 8.9–10.3)
Chloride: 105 mmol/L (ref 98–111)
Creatinine, Ser: 0.95 mg/dL (ref 0.61–1.24)
GFR, Estimated: >60 mL/min
Glucose, Bld: 147 mg/dL — ABNORMAL HIGH (ref 70–99)
Potassium: 3.8 mmol/L (ref 3.5–5.1)
Sodium: 140 mmol/L (ref 135–145)

## 2024-12-22 LAB — HEMOGLOBIN A1C
Hgb A1c MFr Bld: 5.4 % (ref 4.8–5.6)
Mean Plasma Glucose: 108.28 mg/dL

## 2024-12-22 LAB — I-STAT CG4 LACTIC ACID, ED: Lactic Acid, Venous: 2.1 mmol/L (ref 0.5–1.9)

## 2024-12-22 LAB — COMPREHENSIVE METABOLIC PANEL WITH GFR
ALT: 32 U/L (ref 0–44)
AST: 31 U/L (ref 15–41)
Albumin: 4.2 g/dL (ref 3.5–5.0)
Alkaline Phosphatase: 50 U/L (ref 38–126)
Anion gap: 13 (ref 5–15)
BUN: 17 mg/dL (ref 8–23)
CO2: 24 mmol/L (ref 22–32)
Calcium: 8.8 mg/dL — ABNORMAL LOW (ref 8.9–10.3)
Chloride: 104 mmol/L (ref 98–111)
Creatinine, Ser: 1.19 mg/dL (ref 0.61–1.24)
GFR, Estimated: >60 mL/min
Glucose, Bld: 155 mg/dL — ABNORMAL HIGH (ref 70–99)
Potassium: 3.3 mmol/L — ABNORMAL LOW (ref 3.5–5.1)
Sodium: 141 mmol/L (ref 135–145)
Total Bilirubin: 0.4 mg/dL (ref 0.0–1.2)
Total Protein: 6.8 g/dL (ref 6.5–8.1)

## 2024-12-22 LAB — LIPID PANEL
Cholesterol: 193 mg/dL (ref 0–200)
HDL: 40 mg/dL — ABNORMAL LOW
LDL Cholesterol: 104 mg/dL — ABNORMAL HIGH (ref 0–99)
Total CHOL/HDL Ratio: 4.9 ratio
Triglycerides: 247 mg/dL — ABNORMAL HIGH
VLDL: 49 mg/dL — ABNORMAL HIGH (ref 0–40)

## 2024-12-22 LAB — CG4 I-STAT (LACTIC ACID): Lactic Acid, Venous: 1 mmol/L (ref 0.5–1.9)

## 2024-12-22 LAB — POCT ACTIVATED CLOTTING TIME
Activated Clotting Time: 245 s
Activated Clotting Time: 143 s

## 2024-12-22 LAB — MAGNESIUM: Magnesium: 1.9 mg/dL (ref 1.7–2.4)

## 2024-12-22 LAB — PROTIME-INR
INR: 0.9 (ref 0.8–1.2)
Prothrombin Time: 13.2 s (ref 11.4–15.2)

## 2024-12-22 LAB — GLUCOSE, CAPILLARY: Glucose-Capillary: 132 mg/dL — ABNORMAL HIGH (ref 70–99)

## 2024-12-22 LAB — APTT: aPTT: 23 s — ABNORMAL LOW (ref 24–36)

## 2024-12-22 LAB — MRSA NEXT GEN BY PCR, NASAL: MRSA by PCR Next Gen: NOT DETECTED

## 2024-12-22 MED ORDER — SODIUM CHLORIDE 0.9% FLUSH: 3.0000 mL | INTRAVENOUS | Status: DC | PRN

## 2024-12-22 MED ORDER — HEPARIN (PORCINE) 25000 UT/250ML-% IV SOLN
1000.0000 [IU]/h | INTRAVENOUS | Status: DC
  Administered 2024-12-22: 1000 [IU]/h via INTRAVENOUS
  Filled 2024-12-22: qty 250

## 2024-12-22 MED ORDER — FREE WATER
250.0000 mL | Freq: Once | Status: AC
  Administered 2024-12-22: 250 mL via ORAL

## 2024-12-22 MED ORDER — FENTANYL CITRATE (PF) 50 MCG/ML IJ SOSY
50.0000 ug | PREFILLED_SYRINGE | Freq: Once | INTRAMUSCULAR | Status: AC
  Administered 2024-12-22: 50 ug via INTRAVENOUS

## 2024-12-22 MED ORDER — TIROFIBAN HCL IN NACL 5-0.9 MG/100ML-% IV SOLN
INTRAVENOUS | Status: AC | PRN
  Administered 2024-12-22: .15 ug/kg/min via INTRAVENOUS

## 2024-12-22 MED ORDER — FENTANYL CITRATE (PF) 50 MCG/ML IJ SOSY
PREFILLED_SYRINGE | INTRAMUSCULAR | Status: AC
  Filled 2024-12-22: qty 1

## 2024-12-22 MED ORDER — TICAGRELOR 90 MG PO TABS
ORAL_TABLET | ORAL | Status: DC | PRN
  Administered 2024-12-22: 180 mg via ORAL

## 2024-12-22 MED ORDER — TICAGRELOR 90 MG PO TABS
ORAL_TABLET | ORAL | Status: AC
  Filled 2024-12-22: qty 2

## 2024-12-22 MED ORDER — ONDANSETRON HCL 4 MG/2ML IJ SOLN: 4.0000 mg | Freq: Four times a day (QID) | INTRAMUSCULAR | Status: AC | PRN

## 2024-12-22 MED ORDER — HEPARIN (PORCINE) IN NACL 1000-0.9 UT/500ML-% IV SOLN
INTRAVENOUS | Status: DC | PRN
  Administered 2024-12-22 (×2): 500 mL

## 2024-12-22 MED ORDER — SODIUM CHLORIDE 0.9 % IV SOLN: 250.0000 mL | INTRAVENOUS | Status: AC | PRN

## 2024-12-22 MED ORDER — TIROFIBAN HCL IN NACL 5-0.9 MG/100ML-% IV SOLN
0.1500 ug/kg/min | INTRAVENOUS | Status: DC
  Administered 2024-12-22 (×2): 0.15 ug/kg/min via INTRAVENOUS
  Filled 2024-12-22: qty 100

## 2024-12-22 MED ORDER — MIDAZOLAM HCL (PF) 2 MG/2ML IJ SOLN
INTRAMUSCULAR | Status: DC | PRN
  Administered 2024-12-22: 2 mg via INTRAVENOUS

## 2024-12-22 MED ORDER — LOSARTAN POTASSIUM 25 MG PO TABS
12.5000 mg | ORAL_TABLET | Freq: Every day | ORAL | Status: DC
  Administered 2024-12-22 – 2024-12-24 (×3): 12.5 mg via ORAL
  Filled 2024-12-22 (×3): qty 1

## 2024-12-22 MED ORDER — PERFLUTREN LIPID MICROSPHERE
1.0000 mL | INTRAVENOUS | Status: AC | PRN
  Administered 2024-12-22: 2 mL via INTRAVENOUS

## 2024-12-22 MED ORDER — TIROFIBAN HCL IN NACL 5-0.9 MG/100ML-% IV SOLN: 0.1500 ug/kg/min | INTRAVENOUS | Status: AC

## 2024-12-22 MED ORDER — SPIRONOLACTONE 12.5 MG HALF TABLET
12.5000 mg | ORAL_TABLET | Freq: Every day | ORAL | Status: DC
  Administered 2024-12-22 – 2024-12-24 (×3): 12.5 mg via ORAL
  Filled 2024-12-22 (×3): qty 1

## 2024-12-22 MED ORDER — ORAL CARE MOUTH RINSE: 15.0000 mL | OROMUCOSAL | Status: DC | PRN

## 2024-12-22 MED ORDER — CHLORHEXIDINE GLUCONATE CLOTH 2 % EX PADS
6.0000 | MEDICATED_PAD | Freq: Every day | CUTANEOUS | Status: AC
  Administered 2024-12-22 – 2024-12-24 (×3): 6 via TOPICAL

## 2024-12-22 MED ORDER — POTASSIUM CHLORIDE CRYS ER 20 MEQ PO TBCR
40.0000 meq | EXTENDED_RELEASE_TABLET | Freq: Once | ORAL | Status: AC
  Administered 2024-12-22: 40 meq via ORAL
  Filled 2024-12-22: qty 2

## 2024-12-22 MED ORDER — TICAGRELOR 90 MG PO TABS: 90.0000 mg | ORAL_TABLET | Freq: Two times a day (BID) | ORAL | Status: DC

## 2024-12-22 MED ORDER — TICAGRELOR 90 MG PO TABS
90.0000 mg | ORAL_TABLET | Freq: Two times a day (BID) | ORAL | Status: DC
  Administered 2024-12-22 – 2024-12-24 (×4): 90 mg via ORAL
  Filled 2024-12-22 (×4): qty 1

## 2024-12-22 MED ORDER — SODIUM CHLORIDE 0.9% FLUSH
3.0000 mL | Freq: Two times a day (BID) | INTRAVENOUS | Status: AC
  Administered 2024-12-22 – 2024-12-24 (×6): 3 mL via INTRAVENOUS

## 2024-12-22 MED ORDER — SODIUM CHLORIDE 0.9 % IV SOLN
INTRAVENOUS | Status: AC | PRN
  Administered 2024-12-22 (×2): 10 mL/h via INTRAVENOUS

## 2024-12-22 MED ORDER — TIROFIBAN HCL IN NACL 5-0.9 MG/100ML-% IV SOLN
INTRAVENOUS | Status: AC
  Filled 2024-12-22: qty 100

## 2024-12-22 MED ORDER — LIDOCAINE HCL (PF) 1 % IJ SOLN
INTRAMUSCULAR | Status: DC | PRN
  Administered 2024-12-22: 5 mL
  Administered 2024-12-22: 2 mL

## 2024-12-22 MED ORDER — CARVEDILOL 3.125 MG PO TABS
3.1250 mg | ORAL_TABLET | Freq: Two times a day (BID) | ORAL | Status: DC
  Administered 2024-12-22 – 2024-12-24 (×4): 3.125 mg via ORAL
  Filled 2024-12-22 (×4): qty 1

## 2024-12-22 MED ORDER — FENTANYL CITRATE (PF) 100 MCG/2ML IJ SOLN
INTRAMUSCULAR | Status: DC | PRN
  Administered 2024-12-22: 25 ug via INTRAVENOUS

## 2024-12-22 MED ORDER — LIDOCAINE HCL (PF) 1 % IJ SOLN
INTRAMUSCULAR | Status: AC
  Filled 2024-12-22: qty 30

## 2024-12-22 MED ORDER — MIDAZOLAM HCL 2 MG/2ML IJ SOLN
INTRAMUSCULAR | Status: AC
  Filled 2024-12-22: qty 2

## 2024-12-22 MED ORDER — ASPIRIN 81 MG PO CHEW
81.0000 mg | CHEWABLE_TABLET | Freq: Every day | ORAL | Status: AC
  Administered 2024-12-23 – 2024-12-24 (×2): 81 mg via ORAL
  Filled 2024-12-22 (×2): qty 1

## 2024-12-22 MED ORDER — FENTANYL CITRATE (PF) 100 MCG/2ML IJ SOLN
INTRAMUSCULAR | Status: AC
  Filled 2024-12-22: qty 2

## 2024-12-22 MED ORDER — VERAPAMIL HCL 2.5 MG/ML IV SOLN
INTRAVENOUS | Status: AC
  Filled 2024-12-22: qty 2

## 2024-12-22 MED ORDER — IOHEXOL 350 MG/ML SOLN
INTRAVENOUS | Status: DC | PRN
  Administered 2024-12-22: 100 mL via INTRA_ARTERIAL

## 2024-12-22 MED ORDER — TIROFIBAN (AGGRASTAT) BOLUS VIA INFUSION
INTRAVENOUS | Status: DC | PRN
  Administered 2024-12-22: 2552.5 ug via INTRAVENOUS

## 2024-12-22 MED ORDER — MAGNESIUM SULFATE 2 GM/50ML IV SOLN
2.0000 g | Freq: Once | INTRAVENOUS | Status: AC
  Administered 2024-12-22: 2 g via INTRAVENOUS
  Filled 2024-12-22: qty 50

## 2024-12-22 MED ORDER — ACETAMINOPHEN 325 MG PO TABS
650.0000 mg | ORAL_TABLET | ORAL | Status: AC | PRN
  Administered 2024-12-22: 650 mg via ORAL
  Filled 2024-12-22: qty 2

## 2024-12-22 MED ORDER — TICAGRELOR 90 MG PO TABS
90.0000 mg | ORAL_TABLET | Freq: Once | ORAL | Status: AC
  Administered 2024-12-22: 90 mg via ORAL
  Filled 2024-12-22: qty 1

## 2024-12-22 MED ORDER — HEPARIN SODIUM (PORCINE) 1000 UNIT/ML IJ SOLN
INTRAMUSCULAR | Status: DC | PRN
  Administered 2024-12-22: 8000 [IU] via INTRAVENOUS

## 2024-12-22 MED ORDER — ATORVASTATIN CALCIUM 80 MG PO TABS
80.0000 mg | ORAL_TABLET | Freq: Every day | ORAL | Status: DC
  Administered 2024-12-22 – 2024-12-24 (×3): 80 mg via ORAL
  Filled 2024-12-22 (×3): qty 1

## 2024-12-22 MED ORDER — VERAPAMIL HCL 2.5 MG/ML IV SOLN
INTRAVENOUS | Status: DC | PRN
  Administered 2024-12-22: 5 mL via INTRA_ARTERIAL

## 2024-12-22 NOTE — Plan of Care (Signed)

## 2024-12-22 NOTE — TOC Initial Note (Signed)
 Transition of Care [facility]) - Initial/Assessment Note    Patient Details  Name: Brett Lamb MRN: [id number] Date of Birth: 1956/01/03  Transition of Care [facility]) CM/SW Contact:    Arlana JINNY Nicholaus ISRAEL Phone Number: [phone] 12/22/2024, 12:02 PM  Clinical Narrative:     HF CSW met with patient, wife, and daughter at bedside. Patient stated that he lives with wife and son at home. Patient stated that he drives. Patient stated that he has no history of HH services. Patient stated that he does not use any equipment. Patient stated that he has a scale at home. Patient stated that he has a PCP. CSW explained that a hospital follow up appointment is typically scheduled closer towards dc. Patient is agreeable.   HF CSW/CM will continue to follow and monitor for dc readiness.               Expected Discharge Plan: Home/Self Care Barriers to Discharge: Continued Medical Work up   Patient Goals and CMS Choice Patient states their goals for this hospitalization and ongoing recovery are:: return home CMS Medicare.gov Compare Post Acute Care list provided to:: Patient Choice offered to / list presented to : Patient  ownership interest in [facility].provided to:: Patient    Expected Discharge Plan and Services       Living arrangements for the past 2 months: Single Family Home                                      Prior Living Arrangements/Services Living arrangements for the past 2 months: Single Family Home Lives with:: Spouse, Adult Children Patient language and need for interpreter reviewed:: Yes Do you feel safe going back to the place where you live?: Yes      Need for Family Participation in Patient Care: No (Comment) Care giver support system in place?: Yes (comment)   Criminal Activity/Legal Involvement Pertinent to Current Situation/Hospitalization: No - Comment as needed  Activities of Daily Living      Permission Sought/Granted Permission  sought to share information with : PCP                Emotional Assessment Appearance:: Appears stated age Attitude/Demeanor/Rapport: Engaged Affect (typically observed): Appropriate Orientation: : Oriented to Self, Oriented to Place, Oriented to  Time, Oriented to Situation Alcohol / Substance Use: Not Applicable Psych Involvement: No (comment)  Admission diagnosis:  ST elevation myocardial infarction (STEMI), unspecified artery (HCC) [I21.3] Acute ST elevation myocardial infarction (STEMI) of anterolateral wall (HCC) [I21.09] Patient Active Problem List   Diagnosis Date Noted   Acute ST elevation myocardial infarction (STEMI) of anterolateral wall (HCC) 12/22/2024   PCP:  Leonel Cole, MD Pharmacy:   [id number] - [place], [place] - [address] [place] [address] Phone: [phone] Fax: [phone]     Social Drivers of Health (SDOH) Social History: SDOH Screenings   Food Insecurity: No Food Insecurity (12/22/2024)  Housing: Low Risk (12/22/2024)  Transportation Needs: No Transportation Needs (12/22/2024)  Utilities: Not At Risk (12/22/2024)  Social Connections: Socially Integrated (12/22/2024)  Tobacco Use: Low Risk (12/11/2024)   Received from [facility]   SDOH Interventions: Food Insecurity Interventions: Intervention Not Indicated Housing Interventions: Intervention Not Indicated Transportation Interventions: Intervention Not Indicated Utilities Interventions: Intervention Not Indicated Social Connections Interventions: Intervention Not Indicated   Readmission Risk Interventions     No data to  display

## 2024-12-22 NOTE — TOC Progression Note (Signed)
 Transition of Care [facility]) - Progression Note    Patient Details  Name: Brett Lamb MRN: [id number] Date of Birth: 03/07/1956  [facility] CM/SW Contact  Graves-Bigelow, Erminio Deems, RN Phone Number: 12/22/2024, 12:28 PM  Clinical Narrative: ICM received notification that patient will need a Life Vest for home. Information submitted to [employer] Life Vest Rep-Awaiting insurance approval. ICM will continue to follow for additional disposition needs.  Expected Discharge Plan: Home/Self Care Barriers to Discharge: Continued Medical Work up  Living arrangements for the past 2 months: Single Family Home  Social Drivers of Health (SDOH) Interventions SDOH Screenings   Food Insecurity: No Food Insecurity (12/22/2024)  Housing: Low Risk (12/22/2024)  Transportation Needs: No Transportation Needs (12/22/2024)  Utilities: Not At Risk (12/22/2024)  Social Connections: Socially Integrated (12/22/2024)  Tobacco Use: Low Risk (12/11/2024)   Received from [facility]   Readmission Risk Interventions     No data to display

## 2024-12-22 NOTE — Progress Notes (Signed)
 41fr swan ganz removed from right femoral vein, manual pressure applied for 5 minutes, then the IABP catheter was removed from the right femoral artery. Pressure held over both sites for 40 minutes. Site level 0 with slight bruising from holding pressure.  No s+s of hematoma, tegaderm dressing applied, bedrest instructions given.   Bilateral dp and pt pulses present with doppler.    Bedrest begins at 15:15:00

## 2024-12-22 NOTE — ED Notes (Signed)
 Pt transported to cath lab.

## 2024-12-22 NOTE — H&P (Addendum)
 "   Advanced Heart Failure Team History and Physical Note   PCP:  Leonel Cole, MD  PCP-Cardiology: None     Reason for Admission: VT > Cardiac arrest> STEMI HPI:    Brett Lamb is a 69 y.o. male with hx prostate cancer s/p prostatectomy 18' and pre-op pulmonary embolism.   Presented to the ED yesterday with chest pain and chest pressure that started around 2215. EMS was called. Found to be in VT which was then followed by cardiac arrest. Required 2 min CPR, required defib to NSR. Started on ASA, given nitroglycerin and amiodarone by EMS.   Taken to the cath lab emergently on arrival. HsTrop 79>724. L/RHC with relatively stable pressures on RHC. IABP placed.  LHC with MV CAD. LM 30% stenosis, LAD occluded in proximal segment, LAD mid-segment with tandem 50-60% stenosis and apical LAD which bifurcates into diagonal and inferoapical segment with diffuse 80-90% stenosis. LCx ostium with 40% stenosis, gives origin to high OM1 w/ a prox long segment 60% stenosis. Distal Cx 85% stenosis with origin of a large OM 3. RCA with 50% stenosis and mid segment 50% stenosis. S/p successful PCI with stenting of ostial-prox LAD with DES. Started on Aggrastat  x18 hrs. Tx to ICU for further mgmt.   PAP: (16-29)/(7-17) 22/11 CVP:  [7 mmHg-21 mmHg] 7 mmHg CO:  [5.8 L/min] 5.8 L/min CI:  [2.5 L/min/m2] 2.5 L/min/m2  IABP 1:1  Feels good this morning. Wife at bedside. Denies further chest pain. Reports this has never happened before. 1 year ago he did experience a syncopal episode. Went to urgent care and EKG was stable from what they remember but urgent care did suggest going to ED for further workup which he refused. He is very active at baseline. Denies tobacco use. Reports hx of MI in both of his moms brothers.   Home Medications Prior to Admission medications  Medication Sig Start Date End Date Taking? Authorizing Provider  acetaminophen  (TYLENOL ) 500 MG tablet Take 1,000 mg by mouth every 6 (six) hours as  needed.    [provider]  Calcium -Magnesium -Zinc (CAL-MAG-ZINC PO) Take by mouth daily.    [provider]  Multiple Vitamin (MULTIVITAMIN) tablet Take 1 tablet by mouth daily.    [provider]  vitamin E 400 UNIT capsule Take 400 Units by mouth daily.    [provider]    Past Medical History: Past Medical History:  Diagnosis Date   Arthritis    feet   Elevated PSA    Left knee pain    injury     Past Surgical History: Past Surgical History:  Procedure Laterality Date   COLON SURGERY  age 41 months   repair intestinal blockage   PROSTATE BIOPSY N/A 08/04/2017   Procedure: PROSTATE BIOPSY;  Surgeon: Devere Lonni Righter, MD;  [facility]  Service: Urology;  Laterality: N/A;  NEEDS 30 MIN    Family History:  No family history on file.  Social History: Social History   Socioeconomic History   Marital status: Married    Spouse name: Not on file   Number of children: Not on file   Years of education: Not on file   Highest education level: Not on file  Occupational History   Not on file  Tobacco Use   Smoking status: Never   Smokeless tobacco: Never  Vaping Use   Vaping status: Never Used  Substance and Sexual Activity   Alcohol use: No   Drug  use: No   Sexual activity: Not on file  Other Topics Concern   Not on file  Social History Narrative   Not on file   Social Drivers of Health   Tobacco Use: Low Risk (12/11/2024)   Received from [facility]   Patient History    Smoking Tobacco Use: Never    Smokeless Tobacco Use: Never    Passive Exposure: Never  Financial Resource Strain: Not on file  Food Insecurity: No Food Insecurity (12/22/2024)   Epic    Worried About Programme Researcher, Broadcasting/film/video in the Last Year: Never true    Ran Out of Food in the Last Year: Never true  Transportation Needs: No Transportation Needs (12/22/2024)   Epic    Lack of Transportation (Medical): No    Lack of  Transportation (Non-Medical): No  Physical Activity: Not on file  Stress: Not on file  Social Connections: Socially Integrated (12/22/2024)   Social Connection and Isolation Panel    Frequency of Communication with Friends and Family: More than three times a week    Frequency of Social Gatherings with Friends and Family: More than three times a week    Attends Religious Services: More than 4 times per year    Active Member of [employer] or Organizations: Yes    Attends Banker Meetings: More than 4 times per year    Marital Status: Married  Depression (EYV7-0): Not on file  Alcohol Screen: Not on file  Housing: Low Risk (12/22/2024)   Epic    Unable to Pay for Housing in the Last Year: No    Number of Times Moved in the Last Year: 0    Homeless in the Last Year: No  Utilities: Not At Risk (12/22/2024)   Epic    Threatened with loss of utilities: No  Health Literacy: Not on file    Allergies:  Allergies[1]  Objective:    Vital Signs:   Temp:  [97.8 F (36.6 C)-98.7 F (37.1 C)] 98.4 F (36.9 C) (01/16 0830) Pulse Rate:  [0-87] 85 (01/16 0815) Resp:  [7-19] 19 (01/16 0830) BP: (107-129)/(42-93) 127/47 (01/16 0830) SpO2:  [89 %-100 %] 94 % (01/16 0830) Weight:  [102.1 kg-104.6 kg] 104.6 kg (01/16 0217) Last BM Date : 12/21/24 Filed Weights   12/21/24 2346 12/22/24 0217  Weight: 102.1 kg 104.6 kg    Physical Exam    General:  well appearing.   Cor: Regular rate & rhythm, IABP. No murmurs. JVD flat.  Lungs: clear Extremities: no edema. R groin site stable.   Telemetry   NSR 60s, intt PVCs (Personally reviewed)    EKG   NSR 60s, low voltage EKG  Labs  Basic Metabolic Panel: Recent Labs  Lab 12/21/24 2351 12/22/24 0001 12/22/24 0133 12/22/24 0540  NA 141 144 144 140  K 3.3* 3.2* 3.7 3.8  CL 104 106  --  105  CO2 24  --   --  23  GLUCOSE 155* 154*  --  147*  BUN 17 18  --  16  CREATININE 1.19 1.20  --  0.95  CALCIUM  8.8*  --   --  8.8*  MG 1.9   --   --   --     Liver Function Tests: Recent Labs  Lab 12/21/24 2351  AST 31  ALT 32  ALKPHOS 50  BILITOT 0.4  PROT 6.8  ALBUMIN 4.2   No results for input(s): LIPASE, AMYLASE in the last 168 hours. No results  for input(s): AMMONIA in the last 168 hours.  CBC: Recent Labs  Lab 12/21/24 2351 12/22/24 0001 12/22/24 0133 12/22/24 0540  WBC 10.9*  --   --  11.0*  NEUTROABS 6.1  --   --   --   HGB 14.6 14.3 13.6 13.7  HCT 42.7 42.0 40.0 39.4  MCV 94.5  --   --  93.4  PLT 262  --   --  208    Cardiac Enzymes: No results for input(s): CKTOTAL, CKMB, CKMBINDEX, TROPONINI in the last 168 hours.  BNP: BNP (last 3 results) No results for input(s): BNP in the last 8760 hours.  ProBNP (last 3 results) No results for input(s): PROBNP in the last 8760 hours.   CBG: Recent Labs  Lab 12/22/24 0731  GLUCAP 132*    Coagulation Studies: Recent Labs    12/21/24 2351  LABPROT 13.2  INR 0.9    Imaging: CARDIAC CATHETERIZATION Result Date: 12/22/2024 Images from the original result were not included. Cardiac Catheterization 12/22/24: Hemodynamic data: RA 8/6, mean 4 mmHg RV 24/14, EDP 11 mmHg PA 22/16, mean 13 mmHg PW 14/13, mean 12 mmHg. QP/QS 1.0.  CO 4.55, CI 2.01 by Fick.  Papi 1.5. LV 119/7, EDP 17 mmHg.  AO 111/68, mean 86 mmHg.  There is no pressure gradient across the aortic valve. Angiographic data: LM: Distal left main has a 30% stenosis. LAD: Occluded in the proximal segment.  There is an ulcerated lesion in the proximal LAD.  Gives origin to large D1 which is mild disease.  LAD in the midsegment has tandem 50 to 60% stenosis and apical LAD which bifurcates into diagonal and inferoapical segment has diffuse 80 to 90% stenosis. LCx: Ostium has a 40% stenosis.  Gives origin to high OM1 with a proximal long segment 60% stenosis.  Mid Cx is a 50% stenosis followed by origin of a moderate-sized OM 2 and distal Cx is 85% stenosis with origin of a large OM  3. RCA: Large-caliber vessel, proximal RCA has a 50% stenosis and mid segment has a 50% stenosis.  Mild diffuse disease.  Large PL branch. Intervention data: Successful PCI with stenting of the ostial-proximal LAD with a 3.5 x 16 mm Synergy XD DES at 20 atmospheric pressure, stenosis reduced from 100% to 0% with TIMI 0 to TIMI-3 flow.  Occluded apical LAD opened up with dottering with the guidewire. Impression and recommendations: Patient may need repeat cardiac catheterization to reevaluate the anatomy and optimize the stent however stent appears well opposed and well-expanded and if he remains stable, we could relook in the outpatient basis.  Aggressive medical therapy recommended.  Hopefully intra-aortic balloon pump can be taken off tomorrow along with Swan-Ganz catheter.   DG Chest Port 1 View Result Date: 12/22/2024 EXAM: 1 VIEW XRAY OF THE CHEST 12/22/2024 12:09:00 AM COMPARISON: Comparison exam post 08/19/2017. CLINICAL HISTORY: chest pain FINDINGS: LUNGS AND PLEURA: Low lung volumes. Possible left base airspace opacity or pleural effusion. No pneumothorax. HEART AND MEDIASTINUM: No acute abnormality of the cardiac and mediastinal silhouettes. BONES AND SOFT TISSUES: No acute osseous abnormality. IMPRESSION: 1. low lung volumes. Possible left base airspace opacity or pleural effusion. Recommend repeat PA and lateral view chest with improvement inspiratory effort. Electronically signed by: Morgane Naveau MD 12/22/2024 12:12 AM EST RP Workstation: [id number]    Patient Profile   Brett Lamb is a 69 y.o. male with hx prostate cancer s/p prostatectomy 18' and pre-op pulmonary embolism. Now admitted with VT>cardiac arrest>  STEMI.   Assessment/Plan   VT> Cardiac arrest > STEMI - VT in the field, required 2 mins CPR + defib to NSR - LHC with MV CAD. S/p PCI with stenting of ostial-prox LAD with DES.  - Hemodynamics stable on swann. CVP 7 (femoral) - Continue Aggrastat  for 12 hrs.  - Now on hep gtt.   - Continue IABP at 1:1, plan for removal once Aggrastat  complete.  - Ticagrelor  and ASA x1 year after - Keep K >4 and Mg >2 - Start statin - No further VT noted on tele - Plan for possible relook cath OP - Will arrange for LifeVest at discharge with hx of syncope and VT on admission.   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 Cherrie with akinesis of LV apex.  - iCM - NYHA IV on admission - Euvolemic on exam - Start coreg  3.125 mg BID - Start spiro 12.5 mg daily - Start losartan  12.5 mg daily - SGLT2i tomorrow - Will arrange for LifeVest  Hyperlipidemia - LDL 104 - Start statin, goal LDL <55  Prostate cancer - Follows with Dr. Chauncey (urology) at [facility] - Completed radiation  Hx pulmonary embolism - Completed 5 months of Eliquis 18'  CRITICAL CARE Performed by: Beckey LITTIE Coe  Total critical care time: 16 minutes  Critical care time was exclusive of separately billable procedures and treating other patients.  Critical care was necessary to treat or prevent imminent or life-threatening deterioration.  Critical care was time spent personally by me on the following activities: development of treatment plan with patient and/or surrogate as well as nursing, discussions with consultants, evaluation of patient's response to treatment, examination of patient, obtaining history from patient or surrogate, ordering and performing treatments and interventions, ordering and review of laboratory studies, ordering and review of radiographic studies, pulse oximetry and re-evaluation of patient's condition.    Beckey LITTIE Coe, NP 12/22/2024, 9:03 AM  Advanced Heart Failure Team Pager [phone] (M-F; 7a - 5p)   Please visit [email or web address]: For overnight coverage please call cardiology fellow first. If fellow not available call Shock/ECMO MD on call.  For ECMO / Mechanical Support (Impella, IABP, LVAD) issues call Shock / ECMO MD on call.    Agree with above.   He is s/p acute PCI/DES  to LAD in setting of VF arrest. Remains on IABP and aggrastat . Denies further CP.   VItals and swan numbers look good  PAP: (16-50)/(7-37) 28/17 CVP:  [7 mmHg-32 mmHg] 11 mmHg CO:  [5.8 L/min] 5.8 L/min CI:  [2.5 L/min/m2] 2.5 L/min/m2  General:  Lying flat in bed. No resp difficulty HEENT: normal Neck: supple. no JVD.  Cor: Regular rate & rhythm. No rubs, gallops or murmurs. Lungs: clear Abdomen: soft, nontender, nondistended.Good bowel sounds. Extremities: no cyanosis, clubbing, rash, edema R groin swan and IABP Neuro: alert & orientedx3, cranial nerves grossly intact. moves all 4 extremities w/o difficulty. Affect pleasant  Cath and echo films reviewed personally. EF 45%   Will stop aggrastat  at 12p and pull IABP. Continue DAPT/statin  Will start GDMT. Will need Lifevest at d/c.   CRITICAL CARE Performed by: Cherrie Sieving  Total critical care time: 40 minutes  Critical care time was exclusive of separately billable procedures and treating other patients.  Critical care was necessary to treat or prevent imminent or life-threatening deterioration.  Critical care was time spent personally by me (independent of midlevel providers or residents) on the following activities: development of treatment plan with patient  and/or surrogate as well as nursing, discussions with consultants, evaluation of patient's response to treatment, examination of patient, obtaining history from patient or surrogate, ordering and performing treatments and interventions, ordering and review of laboratory studies, ordering and review of radiographic studies, pulse oximetry and re-evaluation of patient's condition.  Toribio Fuel, MD  12:12 PM           [1] No Known Allergies  "

## 2024-12-22 NOTE — Progress Notes (Signed)
" °  Echocardiogram 2D Echocardiogram has been performed.  Koleen KANDICE Popper, RDCS 12/22/2024, 10:56 AM "

## 2024-12-22 NOTE — ED Notes (Signed)
 Cardiology at the bedside.

## 2024-12-22 NOTE — Telephone Encounter (Addendum)
 Pharmacy Patient Advocate Encounter  Insurance verification completed.    The patient is insured through [employer]. Patient has Medicare and is not eligible for a copay card, but may be able to apply for patient assistance or Medicare RX Payment Plan (Patient Must reach out to their plan, if eligible for payment plan), if available.    Ran test claim for Generic Brilinta  90mg  tablet and the current 30 day co-pay is $46.44.  Ran test claim for Farxiga  10mg  tablet and the current 30 day co-pay is $182.59.  Ran test claim for Jardiance  10mg  tablet and the current 30 day co-pay is $204.82.  Ran test claim for Entresto 24-26mg  tablet and the current 30 day co-pay is $126.94.  This test claim was processed through [facility]- copay amounts may vary at other pharmacies due to pharmacy/plan contracts, or as the patient moves through the different stages of their insurance plan.

## 2024-12-22 NOTE — Discharge Instructions (Signed)

## 2024-12-22 NOTE — ED Triage Notes (Signed)
 Pt BIB [facility] from home. Pt had sudden onset of chest pressure, took tums and no relief. On EMS arrival pt found in [facility] and then went unresponsive. EMS initiated CPR total of 2 mins, shocked pt one time.   150 Amio over 10 mins, 1 nitroglycerin, 324mg  ASA.

## 2024-12-22 NOTE — Progress Notes (Signed)
 PHARMACY - ANTICOAGULATION CONSULT NOTE  Pharmacy Consult for heparin  Indication: IABP  Allergies[1]  Patient Measurements: Height: 6' 1 (185.4 cm) Weight: 102.1 kg (225 lb) IBW/kg (Calculated) : 79.9 HEPARIN  DW (KG): 100.5  Vital Signs: BP: 118/77 (01/16 0015) Pulse Rate: 49 (01/16 0015)  Labs: Recent Labs    12/21/24 2351 12/22/24 0001 12/22/24 0133  HGB 14.6 14.3 13.6  HCT 42.7 42.0 40.0  PLT 262  --   --   APTT 23*  --   --   LABPROT 13.2  --   --   INR 0.9  --   --   CREATININE 1.19 1.20  --     Estimated Creatinine Clearance: 71.9 mL/min (by C-G formula based on SCr of 1.2 mg/dL).   Medical History: Past Medical History:  Diagnosis Date   Arthritis    feet   Elevated PSA    Left knee pain    injury    Assessment: 61 yoM presented with chest pain/cardiac arrest (V tach), defibrillated back into sinus rhythm now s/p LHC for STEMI + IABP. Pharmacy consulted to dose heparin  for IABP and to start 2 hours after radial band is removed.  -CBC WNL -LHC: DES to Ost LAD to proxLAD; loaded with Brilinta   -No oral PTA anticoagulation  -TR band removed @0608   Goal of Therapy:  Heparin  level 0.2-0.5 units/ml Monitor platelets by anticoagulation protocol: Yes   Plan:  -No bolus -Start heparin  infusion at 1000 units/hr 2 hours after TR band removed -Aggrastat  0.15 mcg/kg/min IV infusion for total of 18 hours -8h heparin  level -CBC, heparin  level daily   Lynwood Poplar, PharmD, BCPS Clinical Pharmacist 12/22/2024 2:18 AM      [1] No Known Allergies

## 2024-12-23 ENCOUNTER — Encounter (HOSPITAL_COMMUNITY): Payer: Self-pay | Admitting: Cardiology

## 2024-12-23 LAB — BASIC METABOLIC PANEL WITH GFR
Anion gap: 11 (ref 5–15)
BUN: 11 mg/dL (ref 8–23)
CO2: 23 mmol/L (ref 22–32)
Calcium: 8.8 mg/dL — ABNORMAL LOW (ref 8.9–10.3)
Chloride: 105 mmol/L (ref 98–111)
Creatinine, Ser: 1.07 mg/dL (ref 0.61–1.24)
GFR, Estimated: >60 mL/min
Glucose, Bld: 105 mg/dL — ABNORMAL HIGH (ref 70–99)
Potassium: 4 mmol/L (ref 3.5–5.1)
Sodium: 139 mmol/L (ref 135–145)

## 2024-12-23 LAB — CBC
HCT: 40.1 % (ref 39.0–52.0)
Hemoglobin: 14.1 g/dL (ref 13.0–17.0)
MCH: 32.3 pg (ref 26.0–34.0)
MCHC: 35.2 g/dL (ref 30.0–36.0)
MCV: 91.8 fL (ref 80.0–100.0)
Platelets: 197 K/uL (ref 150–400)
RBC: 4.37 MIL/uL (ref 4.22–5.81)
RDW: 13.2 % (ref 11.5–15.5)
WBC: 9.5 K/uL (ref 4.0–10.5)
nRBC: 0 % (ref 0.0–0.2)

## 2024-12-23 LAB — MAGNESIUM: Magnesium: 2 mg/dL (ref 1.7–2.4)

## 2024-12-23 LAB — PHOSPHORUS: Phosphorus: 2.5 mg/dL (ref 2.5–4.6)

## 2024-12-23 MED ORDER — ENOXAPARIN SODIUM 40 MG/0.4ML IJ SOSY
40.0000 mg | PREFILLED_SYRINGE | Freq: Every day | INTRAMUSCULAR | Status: DC
  Administered 2024-12-23 – 2024-12-24 (×2): 40 mg via SUBCUTANEOUS
  Filled 2024-12-23 (×2): qty 0.4

## 2024-12-23 MED ORDER — EMPAGLIFLOZIN 10 MG PO TABS
10.0000 mg | ORAL_TABLET | Freq: Every day | ORAL | Status: DC
  Administered 2024-12-23 – 2024-12-24 (×2): 10 mg via ORAL
  Filled 2024-12-23 (×2): qty 1

## 2024-12-23 NOTE — Plan of Care (Signed)

## 2024-12-23 NOTE — Progress Notes (Signed)
 "   Advanced Heart Failure Rounding Note   Subjective:     IABP removed yesterday.   Feels good. Chest sore from CPR. No angina. Rhythm stable   Echo read as 65-70% but on Definity  images there is clear HK of distal 1/3 of LV EF 45-50%   Objective:   Weight Range:  Vital Signs:   Temp:  [97.1 F (36.2 C)-99 F (37.2 C)] 97.1 F (36.2 C) (01/17 0800) Pulse Rate:  [50-92] 60 (01/17 0900) Resp:  [5-23] 15 (01/17 0900) BP: (94-134)/(6-86) 120/76 (01/17 0900) SpO2:  [91 %-97 %] 95 % (01/17 0900) Weight:  [101.6 kg] 101.6 kg (01/17 0644) Last BM Date : 12/21/24  Weight change: Filed Weights   12/21/24 2346 12/22/24 0217 12/23/24 0644  Weight: 102.1 kg 104.6 kg 101.6 kg    Intake/Output:   Intake/Output Summary (Last 24 hours) at 12/23/2024 1024 Last data filed at 12/23/2024 0920 Gross per 24 hour  Intake 200.58 ml  Output 1650 ml  Net -1449.42 ml     Physical Exam: General:  Well appearing. No resp difficulty HEENT: normal Neck: supple. JVP . Carotids 2+ bilat; no bruits. No lymphadenopathy or thryomegaly appreciated. Cor: PMI nondisplaced. Regular rate & rhythm. No rubs, gallops or murmurs. Lungs: clear Abdomen: soft, nontender, nondistended. No hepatosplenomegaly. No bruits or masses. Good bowel sounds. Extremities: no cyanosis, clubbing, rash, edema groin site ok  Neuro: alert & orientedx3, cranial nerves grossly intact. moves all 4 extremities w/o difficulty. Affect pleasant  Telemetry: NSR 60s Personally reviewed  Labs: Basic Metabolic Panel: Recent Labs  Lab 12/21/24 2351 12/22/24 0001 12/22/24 0133 12/22/24 0540 12/23/24 0139  NA 141 144 144 140 139  K 3.3* 3.2* 3.7 3.8 4.0  CL 104 106  --  105 105  CO2 24  --   --  23 23  GLUCOSE 155* 154*  --  147* 105*  BUN 17 18  --  16 11  CREATININE 1.19 1.20  --  0.95 1.07  CALCIUM  8.8*  --   --  8.8* 8.8*  MG 1.9  --   --   --  2.0  PHOS  --   --   --   --  2.5    Liver Function Tests: Recent Labs   Lab 12/21/24 2351  AST 31  ALT 32  ALKPHOS 50  BILITOT 0.4  PROT 6.8  ALBUMIN 4.2   No results for input(s): LIPASE, AMYLASE in the last 168 hours. No results for input(s): AMMONIA in the last 168 hours.  CBC: Recent Labs  Lab 12/21/24 2351 12/22/24 0001 12/22/24 0133 12/22/24 0540 12/23/24 0139  WBC 10.9*  --   --  11.0* 9.5  NEUTROABS 6.1  --   --   --   --   HGB 14.6 14.3 13.6 13.7 14.1  HCT 42.7 42.0 40.0 39.4 40.1  MCV 94.5  --   --  93.4 91.8  PLT 262  --   --  208 197    Cardiac Enzymes: No results for input(s): CKTOTAL, CKMB, CKMBINDEX, TROPONINI in the last 168 hours.  BNP: BNP (last 3 results) No results for input(s): BNP in the last 8760 hours.  ProBNP (last 3 results) No results for input(s): PROBNP in the last 8760 hours.    Other results:  Imaging: ECHOCARDIOGRAM COMPLETE Result Date: 12/22/2024    ECHOCARDIOGRAM REPORT   Patient Name:   Brett Lamb Date of Exam: 12/22/2024 Medical Rec #:  [id number]  Height:       73.0 in Accession #:    [id number]  Weight:       230.6 lb Date of Birth:  1956-09-30   BSA:          2.286 m Patient Age:    68 years    BP:           129/52 mmHg Patient Gender: M           HR:           65 bpm. Exam Location:  Inpatient Procedure: 2D Echo, Cardiac Doppler, Color Doppler and Intracardiac            Opacification Agent (Both Spectral and Color Flow Doppler were            utilized during procedure). Indications:    CAD I25.10  History:        Patient has no prior history of Echocardiogram examinations.                 Previous Myocardial Infarction and CAD.  Sonographer:    Koleen Popper RDCS Referring Phys: BECKEY LITTIE COE  Sonographer Comments: Image acquisition challenging due to patient body habitus. IMPRESSIONS  1. Poor acoustic windows.  2. Left ventricular ejection fraction, by estimation, is 65 to 70%. The left ventricle has normal function. The left ventricle has no regional wall motion abnormalities.  There is moderate asymmetric left ventricular hypertrophy. Left ventricular diastolic parameters were normal.  3. Right ventricular systolic function is normal. The right ventricular size is normal. There is normal pulmonary artery systolic pressure.  4. The mitral valve is normal in structure. Trivial mitral valve regurgitation.  5. The aortic valve is tricuspid. Aortic valve regurgitation is not visualized. Aortic valve sclerosis/calcification is present, without any evidence of aortic stenosis.  6. The inferior vena cava is normal in size with greater than 50% respiratory variability, suggesting right atrial pressure of 3 mmHg. FINDINGS  Left Ventricle: Left ventricular ejection fraction, by estimation, is 65 to 70%. The left ventricle has normal function. The left ventricle has no regional wall motion abnormalities. Definity  contrast agent was given IV to delineate the left ventricular  endocardial borders. The left ventricular internal cavity size was normal in size. There is moderate asymmetric left ventricular hypertrophy. Left ventricular diastolic parameters were normal. Right Ventricle: The right ventricular size is normal. Right vetricular wall thickness was not assessed. Right ventricular systolic function is normal. There is normal pulmonary artery systolic pressure. The tricuspid regurgitant velocity is 2.07 m/s, and with an assumed right atrial pressure of 3 mmHg, the estimated right ventricular systolic pressure is 20.1 mmHg. Left [facility]: Left atrial size was normal in size. Right [facility]: Right atrial size was normal in size. Pericardium: There is no evidence of pericardial effusion. Mitral Valve: The mitral valve is normal in structure. Trivial mitral valve regurgitation. Tricuspid Valve: The tricuspid valve is not well visualized. Tricuspid valve regurgitation is trivial. Aortic Valve: The aortic valve is tricuspid. Aortic valve regurgitation is not visualized. Aortic valve sclerosis/calcification  is present, without any evidence of aortic stenosis. Pulmonic Valve: The pulmonic valve was not well visualized. Pulmonic valve regurgitation is not visualized. No evidence of pulmonic stenosis. Aorta: The aortic root is normal in size and structure. Ascending aorta measurements are within normal limits for age when indexed to body surface area. Venous: The inferior vena cava is normal in size with greater than 50% respiratory variability, suggesting right atrial pressure of 3  mmHg. IAS/Shunts: No atrial level shunt detected by color flow Doppler.  LEFT VENTRICLE PLAX 2D LVIDd:         4.50 cm      Diastology LVIDs:         2.70 cm      LV e' medial:    7.72 cm/s LV PW:         1.20 cm      LV E/e' medial:  12.0 LV IVS:        1.56 cm      LV e' lateral:   13.70 cm/s LVOT diam:     2.50 cm      LV E/e' lateral: 6.8 LV SV:         127 LV SV Index:   56 LVOT Area:     4.91 cm  LV Volumes (MOD) LV vol d, MOD A2C: 107.0 ml LV vol s, MOD A2C: 31.3 ml LV SV MOD A2C:     75.7 ml RIGHT VENTRICLE             IVC RV S prime:     17.50 cm/s  IVC diam: 1.90 cm TAPSE (M-mode): 1.8 cm LEFT ATRIUM             Index LA diam:        3.80 cm 1.66 cm/m LA Vol (A2C):   36.1 ml 15.79 ml/m LA Vol (A4C):   40.4 ml 17.67 ml/m LA Biplane Vol: 40.9 ml 17.89 ml/m  AORTIC VALVE LVOT Vmax:   119.00 cm/s LVOT Vmean:  82.300 cm/s LVOT VTI:    0.259 m  AORTA Ao Root diam: 3.60 cm Ao Asc diam:  4.10 cm MITRAL VALVE               TRICUSPID VALVE MV Area (PHT): 3.60 cm    TR Peak grad:   17.1 mmHg MV Decel Time: 211 msec    TR Vmax:        207.00 cm/s MV E velocity: 92.80 cm/s MV A velocity: 70.70 cm/s  SHUNTS MV E/A ratio:  1.31        Systemic VTI:  0.26 m                            Systemic Diam: 2.50 cm Vina Gull MD Electronically signed by Vina Gull MD Signature Date/Time: 12/22/2024/1:29:27 PM    Final    CARDIAC CATHETERIZATION Result Date: 12/22/2024 Images from the original result were not included. Cardiac Catheterization  12/22/24: Hemodynamic data: RA 8/6, mean 4 mmHg RV 24/14, EDP 11 mmHg PA 22/16, mean 13 mmHg PW 14/13, mean 12 mmHg. QP/QS 1.0.  CO 4.55, CI 2.01 by Fick.  Papi 1.5. LV 119/7, EDP 17 mmHg.  AO 111/68, mean 86 mmHg.  There is no pressure gradient across the aortic valve. Angiographic data: LM: Distal left main has a 30% stenosis. LAD: Occluded in the proximal segment.  There is an ulcerated lesion in the proximal LAD.  Gives origin to large D1 which is mild disease.  LAD in the midsegment has tandem 50 to 60% stenosis and apical LAD which bifurcates into diagonal and inferoapical segment has diffuse 80 to 90% stenosis. LCx: Ostium has a 40% stenosis.  Gives origin to high OM1 with a proximal long segment 60% stenosis.  Mid Cx is a 50% stenosis followed by origin of a moderate-sized OM 2 and distal Cx is 85% stenosis with  origin of a large OM 3. RCA: Large-caliber vessel, proximal RCA has a 50% stenosis and mid segment has a 50% stenosis.  Mild diffuse disease.  Large PL branch. Intervention data: Successful PCI with stenting of the ostial-proximal LAD with a 3.5 x 16 mm Synergy XD DES at 20 atmospheric pressure, stenosis reduced from 100% to 0% with TIMI 0 to TIMI-3 flow.  Occluded apical LAD opened up with dottering with the guidewire. Impression and recommendations: Patient may need repeat cardiac catheterization to reevaluate the anatomy and optimize the stent however stent appears well opposed and well-expanded and if he remains stable, we could relook in the outpatient basis.  Aggressive medical therapy recommended.  Hopefully intra-aortic balloon pump can be taken off tomorrow along with Swan-Ganz catheter.   DG Chest Port 1 View Result Date: 12/22/2024 EXAM: 1 VIEW XRAY OF THE CHEST 12/22/2024 12:09:00 AM COMPARISON: Comparison exam post 08/19/2017. CLINICAL HISTORY: chest pain FINDINGS: LUNGS AND PLEURA: Low lung volumes. Possible left base airspace opacity or pleural effusion. No pneumothorax. HEART AND  MEDIASTINUM: No acute abnormality of the cardiac and mediastinal silhouettes. BONES AND SOFT TISSUES: No acute osseous abnormality. IMPRESSION: 1. low lung volumes. Possible left base airspace opacity or pleural effusion. Recommend repeat PA and lateral view chest with improvement inspiratory effort. Electronically signed by: Morgane Naveau MD 12/22/2024 12:12 AM EST RP Workstation: [id number]     Medications:     Scheduled Medications:  aspirin   81 mg Oral Daily   atorvastatin   80 mg Oral Daily   carvedilol   3.125 mg Oral BID WC   Chlorhexidine  Gluconate Cloth  6 each Topical Daily   losartan   12.5 mg Oral Daily   sodium chloride  flush  3 mL Intravenous Q12H   spironolactone   12.5 mg Oral Daily   ticagrelor   90 mg Oral BID    Infusions:   PRN Medications: acetaminophen , ondansetron  (ZOFRAN ) IV, mouth rinse, sodium chloride  flush   Assessment/Plan:   VT> Cardiac arrest > STEMI - VT in the field, required 2 mins CPR + defib to NSR - LHC with MV CAD. S/p PCI with stenting of ostial-prox LAD with DES.  - IABP removed 1/16 - Continue DAPT/statin - Given VT and post cath PVCs Will arrange for LifeVest  - CR consulted   Acute HFmrEF - Echo with EF EF 45-50% with akinesis of LV apex.  - iCM - NYHA IV on admission - Euvolemic on exam - Continue coreg  3.125 mg BID - Continue spiro 12.5 mg daily - Continue losartan  12.5 mg daily - Start Jardiance    Hyperlipidemia - LDL 104 - Statin atarted, goal LDL <55   Prostate cancer - Follows with Dr. Chauncey (urology) at [facility] - Completed radiation   Hx pulmonary embolism - Completed 5 months of Eliquis 63'  Ok to go to floor today.       Length of Stay: 1   Toribio Fuel MD 12/23/2024, 10:24 AM  Advanced Heart Failure Team Pager [phone] (M-F; 7a - 4p)  Please contact [facility] for night-coverage after hours (4p -7a ) and weekends on [email or web address]  "

## 2024-12-23 NOTE — Progress Notes (Signed)
 CARDIAC REHAB PHASE I   PRE:  Rate/Rhythm: 62 SR   BP:  Sitting: 129/77      SaO2: 98 RA   MODE:  Ambulation: 400 ft   POST:  Rate/Rhythm: 72 SR   BP:  Sitting: 130/72      SaO2: 99  Pt sitting in chair feeling well today. Ambulated independently in hallway, moving at quick steady pace. Tolerated well with no CP, SOB or dizziness. Returned to chair with call bell and bedside table in reach.  Post MI/stent education including restrictions, risk factors, exercise guidelines, antiplatelet therapy importance, MI booklet, NTG use, heart healthy diet and CRP2 reviewed. All questions and concerns addressed. Will refer to [facility] for CRP2. Will continue to follow.   [id number] Vaughn Asberry Hacking, RN BSN 12/23/2024 1:27 PM

## 2024-12-23 NOTE — Plan of Care (Signed)

## 2024-12-24 ENCOUNTER — Other Ambulatory Visit (HOSPITAL_COMMUNITY): Payer: Self-pay

## 2024-12-24 DIAGNOSIS — I5020 Unspecified systolic (congestive) heart failure: Secondary | ICD-10-CM | POA: Diagnosis present

## 2024-12-24 DIAGNOSIS — E7800 Pure hypercholesterolemia, unspecified: Secondary | ICD-10-CM | POA: Diagnosis present

## 2024-12-24 DIAGNOSIS — I255 Ischemic cardiomyopathy: Secondary | ICD-10-CM | POA: Diagnosis present

## 2024-12-24 DIAGNOSIS — I469 Cardiac arrest, cause unspecified: Secondary | ICD-10-CM | POA: Diagnosis present

## 2024-12-24 LAB — CBC
HCT: 44 % (ref 39.0–52.0)
Hemoglobin: 15.6 g/dL (ref 13.0–17.0)
MCH: 32.5 pg (ref 26.0–34.0)
MCHC: 35.5 g/dL (ref 30.0–36.0)
MCV: 91.7 fL (ref 80.0–100.0)
Platelets: 188 K/uL (ref 150–400)
RBC: 4.8 MIL/uL (ref 4.22–5.81)
RDW: 13.1 % (ref 11.5–15.5)
WBC: 10.1 K/uL (ref 4.0–10.5)
nRBC: 0 % (ref 0.0–0.2)

## 2024-12-24 LAB — BASIC METABOLIC PANEL WITH GFR
Anion gap: 12 (ref 5–15)
BUN: 16 mg/dL (ref 8–23)
CO2: 23 mmol/L (ref 22–32)
Calcium: 9.2 mg/dL (ref 8.9–10.3)
Chloride: 105 mmol/L (ref 98–111)
Creatinine, Ser: 1.13 mg/dL (ref 0.61–1.24)
GFR, Estimated: >60 mL/min
Glucose, Bld: 91 mg/dL (ref 70–99)
Potassium: 3.8 mmol/L (ref 3.5–5.1)
Sodium: 139 mmol/L (ref 135–145)

## 2024-12-24 LAB — MAGNESIUM: Magnesium: 1.9 mg/dL (ref 1.7–2.4)

## 2024-12-24 MED ORDER — LOSARTAN POTASSIUM 25 MG PO TABS
12.5000 mg | ORAL_TABLET | Freq: Every day | ORAL | 3 refills | Status: AC
  Filled 2024-12-24: qty 45, 90d supply, fill #0

## 2024-12-24 MED ORDER — MAGNESIUM SULFATE 2 GM/50ML IV SOLN
2.0000 g | Freq: Once | INTRAVENOUS | Status: AC
  Administered 2024-12-24: 2 g via INTRAVENOUS
  Filled 2024-12-24: qty 50

## 2024-12-24 MED ORDER — CARVEDILOL 3.125 MG PO TABS
3.1250 mg | ORAL_TABLET | Freq: Two times a day (BID) | ORAL | 3 refills | Status: AC
  Filled 2024-12-24: qty 180, 90d supply, fill #0

## 2024-12-24 MED ORDER — ATORVASTATIN CALCIUM 80 MG PO TABS
80.0000 mg | ORAL_TABLET | Freq: Every day | ORAL | 3 refills | Status: AC
  Filled 2024-12-24: qty 90, 90d supply, fill #0

## 2024-12-24 MED ORDER — SPIRONOLACTONE 25 MG PO TABS
12.5000 mg | ORAL_TABLET | Freq: Every day | ORAL | 3 refills | Status: DC
  Filled 2024-12-24: qty 45, 90d supply, fill #0

## 2024-12-24 MED ORDER — TICAGRELOR 90 MG PO TABS
90.0000 mg | ORAL_TABLET | Freq: Two times a day (BID) | ORAL | 3 refills | Status: AC
  Filled 2024-12-24: qty 60, 30d supply, fill #0

## 2024-12-24 MED ORDER — ASPIRIN 81 MG PO CHEW: 81.0000 mg | CHEWABLE_TABLET | Freq: Every day | ORAL | Status: AC

## 2024-12-24 MED ORDER — DAPAGLIFLOZIN PROPANEDIOL 10 MG PO TABS: 10.0000 mg | ORAL_TABLET | Freq: Every day | ORAL | Status: DC

## 2024-12-24 MED ORDER — DAPAGLIFLOZIN PROPANEDIOL 10 MG PO TABS
10.0000 mg | ORAL_TABLET | Freq: Every day | ORAL | 3 refills | Status: DC
  Filled 2024-12-24: qty 30, 30d supply, fill #0

## 2024-12-24 MED ORDER — EMPAGLIFLOZIN 10 MG PO TABS
10.0000 mg | ORAL_TABLET | Freq: Every day | ORAL | 3 refills | Status: AC
  Filled 2024-12-24: qty 30, 30d supply, fill #0

## 2024-12-24 MED ORDER — POTASSIUM CHLORIDE CRYS ER 20 MEQ PO TBCR
40.0000 meq | EXTENDED_RELEASE_TABLET | Freq: Once | ORAL | Status: AC
  Administered 2024-12-24: 40 meq via ORAL
  Filled 2024-12-24: qty 2

## 2024-12-24 NOTE — Discharge Summary (Addendum)
 " Discharge Summary   Patient ID: Brett Lamb MRN: [id number]; DOB: 19-Jun-1956  Admit date: 12/21/2024 Discharge date: 12/24/2024  PCP:  Leonel Cole, MD   [facility] Providers Cardiologist:  Gordy Bergamo, MD       Discharge Diagnoses  Principal Problem:   Acute ST elevation myocardial infarction (STEMI) of anterolateral wall [facility]) Active Problems:   Cardiac arrest (HCC)   Heart failure with mildly reduced ejection fraction (HFmrEF, 41-49%) (HCC)   Ischemic cardiomyopathy   Pure hypercholesterolemia   Diagnostic Studies/Procedures  CARDIAC CATHETERIZATION 12/22/2024  Conclusion Cardiac Catheterization 12/22/24: Hemodynamic data: RA 8/6, mean 4 mmHg RV 24/14, EDP 11 mmHg PA 22/16, mean 13 mmHg PW 14/13, mean 12 mmHg. QP/QS 1.0.  CO 4.55, CI 2.01 by Fick.  Papi 1.5.  LV 119/7, EDP 17 mmHg.  AO 111/68, mean 86 mmHg.  There is no pressure gradient across the aortic valve.  Angiographic data: LM: Distal left main has a 30% stenosis. LAD: Occluded in the proximal segment.  There is an ulcerated lesion in the proximal LAD.  Gives origin to large D1 which is mild disease.  LAD in the midsegment has tandem 50 to 60% stenosis and apical LAD which bifurcates into diagonal and inferoapical segment has diffuse 80 to 90% stenosis. LCx: Ostium has a 40% stenosis.  Gives origin to high OM1 with a proximal long segment 60% stenosis.  Mid Cx is a 50% stenosis followed by origin of a moderate-sized OM 2 and distal Cx is 85% stenosis with origin of a large OM 3. RCA: Large-caliber vessel, proximal RCA has a 50% stenosis and mid segment has a 50% stenosis.  Mild diffuse disease.  Large PL branch.  Intervention data: Successful PCI with stenting of the ostial-proximal LAD with a 3.5 x 16 mm Synergy XD DES at 20 atmospheric pressure, stenosis reduced from 100% to 0% with TIMI 0 to TIMI-3 flow.  Occluded apical LAD opened up with dottering with the guidewire.  Impression and  recommendations: Patient may need repeat cardiac catheterization to reevaluate the anatomy and optimize the stent however stent appears well opposed and well-expanded and if he remains stable, we could relook in the outpatient basis.  Aggressive medical therapy recommended.  Hopefully intra-aortic balloon pump can be taken off tomorrow along with Swan-Ganz catheter.         ECHOCARDIOGRAM COMPLETE 12/22/2024 IMPRESSIONS 1. Poor acoustic windows. 2. Left ventricular ejection fraction, by estimation, is 65 to 70%. The left ventricle has normal function. The left ventricle has no regional wall motion abnormalities. There is moderate asymmetric left ventricular hypertrophy. Left ventricular diastolic parameters were normal. 3. Right ventricular systolic function is normal. The right ventricular size is normal. There is normal pulmonary artery systolic pressure. 4. The mitral valve is normal in structure. Trivial mitral valve regurgitation. 5. The aortic valve is tricuspid. Aortic valve regurgitation is not visualized. Aortic valve sclerosis/calcification is present, without any evidence of aortic stenosis. 6. The inferior vena cava is normal in size with greater than 50% respiratory variability, suggesting right atrial pressure of 3 mmHg. _____________    History of Present Illness   Brett Lamb is a 69 y.o. male with   - Prostate CA - Hx of pulmonary embolism   He developed chest pain at home around 10 pm and called EMS. He was found to be in VT upon their arrival and proceeded to VF arrest. Pt required CPR x 2 min, defib x 1 with ROSC. He was started  on Amiodarone, ASA, NTG and brought to the emergency room at [facility]. He was taken emergently to the cardiac catheterization lab. Angiography revealed multivessel CAD with LAD occluded in the proximal segment, mid LAD 50-60% and apical LAD with diffuse 9=80-90%. There was 85% in the distal LCx, 60% in the OM1 and 50% RCA stenosis. He underwent  PCI with DES to the ostial/proximal LAD. IABP was placed for hemodynamic support.     Hospital Course   Consultants:     His IABP was DC'd 1/16. His Echocardiogram was read as normal EF but it was personally reviewed by Dr. Cherrie in the ICU. This demonstrated EF 45-50% with akinesis of the LV apex. Given the patient's presentation and noted post cardiac catheterization PVCs, it was recommended that he be discharged on a LifeVest.  This was arranged. He was started on GDMT with Coreg , Spironolactone , Losartan , Jardiance .  He did well post PCI. He had no further chest pain but did note some chest soreness from CPR. He remained stable and was seen by Dr. Cherrie today. The patient is doing well and is felt to be stable and ready for discharge to home. He will need a BMET done at his follow up (Spiro started).      Did the patient have an acute coronary syndrome (MI, NSTEMI, STEMI, etc) this admission?:  Yes                               AHA/ACC ACS Clinical Performance & Quality Measures: Aspirin  prescribed? - Yes ADP Receptor Inhibitor (Plavix/Clopidogrel, Brilinta /Ticagrelor  or Effient/Prasugrel) prescribed (includes medically managed patients)? - Yes Beta Blocker prescribed? - Yes High Intensity Statin (Lipitor 40-80mg  or Crestor 20-40mg ) prescribed? - Yes EF assessed during THIS hospitalization? - Yes For EF <40%, was ACEI/ARB prescribed? - Yes For EF <40%, Aldosterone Antagonist (Spironolactone  or Eplerenone) prescribed? - Yes Cardiac Rehab Phase II ordered (including medically managed patients)? - Yes    _____________  Discharge Vitals Blood pressure (!) 100/58, pulse 68, temperature (!) 97.5 F (36.4 C), temperature source Axillary, resp. rate 18, height 6' 1 (1.854 m), weight 101.6 kg, SpO2 95%.  Filed Weights   12/21/24 2346 12/22/24 0217 12/23/24 0644  Weight: 102.1 kg 104.6 kg 101.6 kg    Labs & Radiologic Studies  CBC Recent Labs    12/21/24 2351 12/22/24 0001  12/23/24 0139 12/24/24 0443  WBC 10.9*   < > 9.5 10.1  NEUTROABS 6.1  --   --   --   HGB 14.6   < > 14.1 15.6  HCT 42.7   < > 40.1 44.0  MCV 94.5   < > 91.8 91.7  PLT 262   < > 197 188   < > = values in this interval not displayed.   Basic Metabolic Panel Recent Labs    98/82/73 0139 12/24/24 0443  NA 139 139  K 4.0 3.8  CL 105 105  CO2 23 23  GLUCOSE 105* 91  BUN 11 16  CREATININE 1.07 1.13  CALCIUM  8.8* 9.2  MG 2.0 1.9  PHOS 2.5  --    Liver Function Tests Recent Labs    12/21/24 2351  AST 31  ALT 32  ALKPHOS 50  BILITOT 0.4  PROT 6.8  ALBUMIN 4.2    Recent Labs  Lab 12/21/24 2351 12/22/24 0315  TRNPT 79* 724*      Hemoglobin A1C Recent Labs    12/21/24  2351  HGBA1C 5.4   Fasting Lipid Panel Recent Labs    12/21/24 2351  CHOL 193  HDL 40*  LDLCALC 104*  TRIG 247*  CHOLHDL 4.9     _____________   DG Chest Port 1 View Result Date: 12/22/2024 EXAM: 1 VIEW XRAY OF THE CHEST 12/22/2024 12:09:00 AM COMPARISON: Comparison exam post 08/19/2017. CLINICAL HISTORY: chest pain FINDINGS: LUNGS AND PLEURA: Low lung volumes. Possible left base airspace opacity or pleural effusion. No pneumothorax. HEART AND MEDIASTINUM: No acute abnormality of the cardiac and mediastinal silhouettes. BONES AND SOFT TISSUES: No acute osseous abnormality. IMPRESSION: 1. low lung volumes. Possible left base airspace opacity or pleural effusion. Recommend repeat PA and lateral view chest with improvement inspiratory effort. Electronically signed by: Morgane Naveau MD 12/22/2024 12:12 AM EST RP Workstation: [id number]    Disposition Pt is being discharged home today in good condition.  Follow-up Plans & Appointments  Follow-up Information     Leonel Cole, MD. Call.   Specialty: Family Medicine Contact information: [address] [place] [address] [phone]         Goodrich, Callie E, PA-C Follow up on 01/01/2025.   Specialty: Cardiology Why:  Hospital Follow Up,  Appt Time: 2:45 PM Please arrive 15 min early. Bring all medications. Contact information: [address] [place] [address] [phone]                Discharge Instructions     (HEART FAILURE PATIENTS) Call MD:  Anytime you have any of the following symptoms: 1) 3 pound weight gain in 24 hours or 5 pounds in 1 week 2) shortness of breath, with or without a dry hacking cough 3) swelling in the hands, feet or stomach 4) if you have to sleep on extra pillows at night in order to breathe.   Complete by: As directed    Amb Referral to Cardiac Rehabilitation   Complete by: As directed    Diagnosis:  STEMI Coronary Stents     After initial evaluation and assessments completed: Virtual Based Care may be provided alone or in conjunction with Phase 2 Cardiac Rehab based on patient barriers.: Yes   Intensive Cardiac Rehabilitation (ICR) [facility] location only OR Traditional Cardiac Rehabilitation (TCR) *If criteria for ICR are not met will enroll in TCR [facility] only): Yes   Driving Restrictions   Complete by: As directed    You cannot drive until cleared by your cardiologist.   Increase activity slowly   Complete by: As directed    Lifting restrictions   Complete by: As directed    No lifting over 5 lbs for now   Sexual Activity Restrictions   Complete by: As directed    None for now       Discharge Medications Allergies as of 12/24/2024   No Known Allergies      Medication List     TAKE these medications    aspirin  81 MG chewable tablet Chew 1 tablet (81 mg total) by mouth daily. Start taking on: December 25, 2024   atorvastatin  80 MG tablet Commonly known as: LIPITOR Take 1 tablet (80 mg total) by mouth daily. Start taking on: December 25, 2024   CAL-MAG-ZINC PO Take 1 tablet by mouth at bedtime.   carvedilol  3.125 MG tablet Commonly known as: COREG  Take 1 tablet (3.125 mg total) by mouth 2 (two) times daily with a meal.    empagliflozin  10 MG Tabs tablet Commonly known as: Jardiance  Take 1  tablet (10 mg total) by mouth daily.   losartan  25 MG tablet Commonly known as: COZAAR  Take 0.5 tablets (12.5 mg total) by mouth daily. Start taking on: December 25, 2024   multivitamin tablet Take 1 tablet by mouth at bedtime.   spironolactone  25 MG tablet Commonly known as: ALDACTONE  Take 0.5 tablets (12.5 mg total) by mouth daily. Start taking on: December 25, 2024   ticagrelor  90 MG Tabs tablet Commonly known as: BRILINTA  Take 1 tablet (90 mg total) by mouth 2 (two) times daily.   vitamin E 180 MG (400 UNITS) capsule Take 400 Units by mouth at bedtime.               Durable Medical Equipment  (From admission, onward)           Start     Ordered   12/22/24 1145  For home use only DME Vest life vest  Once       Question Answer Comment  Indication: Cardiac Arrest due to VF or sustained VT   Life Vest Setting: VT Heart Rate Threshold - Default 150 BPM   Life Vest Setting: VF Heart Rate Threshold - Default 200 BPM   Life Vest Setting: Treatment Energy - Default 150 Joules, all five shocks   Length of need: 3 months   Start date: 12/23/24      12/22/24 1145             Outstanding Labs/Studies BMET - To be done at follow up appointment 01/01/25  Duration of Discharge Encounter: APP Time: 29 minutes   Signed, Glendia Ferrier, PA-C 12/24/2024, 1:39 PM   Patient seen and examined with the above-signed Advanced Practice Provider and/or Housestaff. I personally reviewed laboratory data, imaging studies and relevant notes. I independently examined the patient and formulated the important aspects of the plan. I have edited the note to reflect any of my changes or salient points. I have personally discussed the plan with the patient and/or family.  Please see my rounding note from today with full details and exam  MD d/c time 40 mins   Toribio Fuel, MD  3:58 PM   "

## 2024-12-24 NOTE — Progress Notes (Addendum)
 Discharged to home with wife via personal vehicle. Life Vest worn by patient at time of discharge.  D/C instructions and meds given.  Morna, PharmD at bedside answering medication questions.  Telemetry discontinued.  All other questions addressed and answered. PIV x2 removed with dressing at sites.  Cell phone w/cord, clothing and Life Vest taken with pt at d/c.  Patient awake, alert and oriented at time of discharge.

## 2024-12-24 NOTE — Plan of Care (Signed)
  Problem: Clinical Measurements: Goal: Ability to maintain clinical measurements within normal limits will improve Outcome: Adequate for Discharge Goal: Will remain free from infection Outcome: Adequate for Discharge Goal: Diagnostic test results will improve Outcome: Adequate for Discharge Goal: Respiratory complications will improve Outcome: Adequate for Discharge Goal: Cardiovascular complication will be avoided Outcome: Adequate for Discharge   

## 2024-12-24 NOTE — TOC Transition Note (Signed)
 Transition of Care [facility]) - Discharge Note   Patient Details  Name: BOOKER BHATNAGAR MRN: [id number] Date of Birth: 1955/12/15  [facility] of [facility]) CM/SW Contact:  Marval Gell, RN Phone Number: 12/24/2024, 2:02 PM   Clinical Narrative:     Per chart review, patient has been fitted for life vest. Meds filled through [facility] pharmacy. No other needs identified for DC   Final next level of care: Other (comment) (To be determined) Barriers to Discharge: Continued Medical Work up   Patient Goals and CMS Choice Patient states their goals for this hospitalization and ongoing recovery are:: return home CMS Medicare.gov Compare Post Acute Care list provided to:: Patient Choice offered to / list presented to : Patient [place] ownership interest in [facility].provided to:: Patient    Discharge Placement                       Discharge Plan and Services Additional resources added to the After Visit Summary for                                       Social Drivers of Health (SDOH) Interventions SDOH Screenings   Food Insecurity: No Food Insecurity (12/22/2024)  Housing: Low Risk (12/22/2024)  Transportation Needs: No Transportation Needs (12/22/2024)  Utilities: Not At Risk (12/22/2024)  Social Connections: Socially Integrated (12/22/2024)  Tobacco Use: Low Risk (12/23/2024)     Readmission Risk Interventions     No data to display

## 2024-12-24 NOTE — Progress Notes (Signed)
 "   Advanced Heart Failure Rounding Note   Subjective:     Echo read as 65-70% but on Definity  images there is clear HK of distal 1/3 of LV EF 45-50%  Has been fitted for LifeVest  Feels good. No CP or SOB  Tolerating GDMT    Objective:   Weight Range:  Vital Signs:   Temp:  [97.3 F (36.3 C)-98.6 F (37 C)] 97.3 F (36.3 C) (01/18 0700) Pulse Rate:  [55-85] 67 (01/18 0849) Resp:  [12-20] 18 (01/18 0849) BP: (100-145)/(52-82) 120/74 (01/18 0804) SpO2:  [94 %-98 %] 97 % (01/18 0849) Last BM Date : 12/23/24  Weight change: Filed Weights   12/21/24 2346 12/22/24 0217 12/23/24 0644  Weight: 102.1 kg 104.6 kg 101.6 kg    Intake/Output:   Intake/Output Summary (Last 24 hours) at 12/24/2024 1144 Last data filed at 12/24/2024 0804 Gross per 24 hour  Intake 250 ml  Output 1625 ml  Net -1375 ml     Physical Exam: General:  Sitting up in bed. No resp difficulty HEENT: normal Neck: supple. no JVD.  Cor: Regular rate & rhythm. No rubs, gallops or murmurs. Lungs: clear Abdomen: soft, nontender, nondistended.Good bowel sounds. Extremities: no cyanosis, clubbing, rash, edema Neuro: alert & orientedx3, cranial nerves grossly intact. moves all 4 extremities w/o difficulty. Affect pleasant   Telemetry: NSR 60-70s Personally reviewed  Labs: Basic Metabolic Panel: Recent Labs  Lab 12/21/24 2351 12/22/24 0001 12/22/24 0133 12/22/24 0540 12/23/24 0139 12/24/24 0443  NA 141 144 144 140 139 139  K 3.3* 3.2* 3.7 3.8 4.0 3.8  CL 104 106  --  105 105 105  CO2 24  --   --  23 23 23   GLUCOSE 155* 154*  --  147* 105* 91  BUN 17 18  --  16 11 16   CREATININE 1.19 1.20  --  0.95 1.07 1.13  CALCIUM  8.8*  --   --  8.8* 8.8* 9.2  MG 1.9  --   --   --  2.0 1.9  PHOS  --   --   --   --  2.5  --     Liver Function Tests: Recent Labs  Lab 12/21/24 2351  AST 31  ALT 32  ALKPHOS 50  BILITOT 0.4  PROT 6.8  ALBUMIN 4.2   No results for input(s): LIPASE, AMYLASE in  the last 168 hours. No results for input(s): AMMONIA in the last 168 hours.  CBC: Recent Labs  Lab 12/21/24 2351 12/22/24 0001 12/22/24 0133 12/22/24 0540 12/23/24 0139 12/24/24 0443  WBC 10.9*  --   --  11.0* 9.5 10.1  NEUTROABS 6.1  --   --   --   --   --   HGB 14.6 14.3 13.6 13.7 14.1 15.6  HCT 42.7 42.0 40.0 39.4 40.1 44.0  MCV 94.5  --   --  93.4 91.8 91.7  PLT 262  --   --  208 197 188    Cardiac Enzymes: No results for input(s): CKTOTAL, CKMB, CKMBINDEX, TROPONINI in the last 168 hours.  BNP: BNP (last 3 results) No results for input(s): BNP in the last 8760 hours.  ProBNP (last 3 results) No results for input(s): PROBNP in the last 8760 hours.    Other results:  Imaging: No results found.    Medications:     Scheduled Medications:  aspirin   81 mg Oral Daily   atorvastatin   80 mg Oral Daily   carvedilol   3.125  mg Oral BID WC   Chlorhexidine  Gluconate Cloth  6 each Topical Daily   empagliflozin   10 mg Oral Daily   enoxaparin  (LOVENOX ) injection  40 mg Subcutaneous Daily   losartan   12.5 mg Oral Daily   sodium chloride  flush  3 mL Intravenous Q12H   spironolactone   12.5 mg Oral Daily   ticagrelor   90 mg Oral BID    Infusions:   PRN Medications: acetaminophen , ondansetron  (ZOFRAN ) IV, mouth rinse, sodium chloride  flush   Assessment/Plan:   VT> Cardiac arrest > STEMI - VT in the field, required 2 mins CPR + defib to NSR - LHC with MV CAD. S/p PCI with stenting of ostial-prox LAD with DES.  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  - iCM - Euvolemic on exam - Continue coreg  3.125 mg BID - Continue spiro 12.5 mg daily - Continue losartan  12.5 mg daily - Continue Jardiance  10   Hyperlipidemia - LDL 104 - Statin atarted, goal LDL <55   Prostate cancer - Follows with Dr. Chauncey  (urology) at [facility] - Completed radiation   Hx pulmonary embolism - Completed 5 months of Eliquis 68'  Ok for d/c today        Length of Stay: 2   Toribio Fuel MD 12/24/2024, 11:44 AM  Advanced Heart Failure Team Pager [phone] (M-F; 7a - 4p)  Please contact [facility] for night-coverage after hours (4p -7a ) and weekends on [email or web address]  "

## 2024-12-24 NOTE — Progress Notes (Unsigned)
 "  Cardiology Office Note:    Date:  12/24/2024   ID:  Brett Lamb, DOB 1956-01-05, MRN [id number]  PCP:  Leonel Cole, MD  Cardiologist:  Gordy Bergamo, MD { Click to update primary MD,subspecialty MD or APP then REFRESH:1}    Referring MD: Leonel Cole, MD   Chief Complaint: hospital follow-up of STEMI and VT arrest  History of Present Illness:    Brett Lamb is a 69 y.o. 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   Patient was first seen by Cardiology during recent hospitalization. He was admitted from *** to 12/24/2024 for a STEMI and cardiac arrest. Patient called EMS on *** for chest pain and when EMS was arrived he was noted to be in VT. He then went into cardiac arrest. CPR was started and patient was defibrillated back into sinus rhythm. He was started on Amiodarone en route to the hospital. High-sensitivity troponin peaked at **. Emergent cardiac catheterization showed 30% stenosis of left main, occluded proximal LAD with tandem 50-60% stenosis of mid LAD and 80-90% stenosis of distal LAD, mild to moderate disease of proximal to mid LCX with 85% stenosis of distal LCX, and moderate disease of RCA.  RHC showed relatively stable pressures. IABP was placed and patient underwent successful PCI with DES to the ostial-proximal LAD. Occluded apical LAD opened up with dottering with the guidewire. He was treated with Aggrastat  for 18 hours and started on DAPT as well as a high-intensity statin.  Echo report read LVEF as 65-70%. However, per Dr. Nelle read, LVEF 45-50% with akinesis of the LV apex. He was started on GDMT. He was discharged on Aspirin , Brilinta , Lipitor, Losartan , Coreg , Spironolactone , and Jardiance . He was  discharge with Life Vest given he presented with VT and was noted to have PVCs after cath.  Patient presents today for follow-up. ***  CAD with Recent STEMI Cardiac Arrest Secondary to VT Patient was recently admitted earlier this month with STEMI and cardiac arrest secondary to VT. Emergent LHC showed occluded ostial to proximal LAD lesion as well as severe distal LAD disease, severe distal LCX disease, and otherwise moderate disease. IABP was placed and he underwent successful PCI with DES to ostial-proximal LAD. He was treated with Aggrastat  for 18 hours following cath. - Doing well. No angina. No palpitations. *** - Continue Coreg  3.125mg  twice dialy.  - Continue DAPT with Aspirin  81mg  daily and Brilinta  90mg  twice daily.  - Continue Lipitor 80mg  daily. - LifeVest ***  Ischemic Cardiomyopathy Chronic HFmrEF Echo report during recent admission for STEMI/ cardiac arrest read EF as 65-70%. However, Dr. Nelle read, LVEF 45-50% with akinesis of the LV apex.  - Euvolemic on exam.  - Continue Losartan  12.5mg  daily.  - Continue Coreg  3.125mg  twice daily.  - Continue Spironolactone  12.5mg  daily.  - Continue Jardiance  10mg  daily.  - Will repeat BMET today.   Hyperlipidemia Lipid panel on 12/21/2024: Total Cholesterol 193, Triglycerides 247, HDL 40, LDL 105. LDL goal <55. - Continue Lipitor 80mg  daily (started during recent admission). - Will repeat lipid panel and LFTs in 6-8 weeks. ***  History of PE Diagnosed in 2018. Completed course of anticoagulation.   EKGs/Labs/Other Studies Reviewed:    The following studies were  reviewed:  Cardiac Catheterization 12/22/2024: Hemodynamic Data: RA 8/6, mean 4 mmHg RV 24/14, EDP 11 mmHg PA 22/16, mean 13 mmHg PW 14/13, mean 12 mmHg. QP/QS 1.0.  CO 4.55, CI 2.01 by Fick.  Papi 1.5.   LV 119/7, EDP 17 mmHg.  AO 111/68, mean 86 mmHg.  There is no pressure gradient across the aortic valve.   Angiographic Data: LM: Distal left main has a  30% stenosis. LAD: Occluded in the proximal segment.  There is an ulcerated lesion in the proximal LAD.  Gives origin to large D1 which is mild disease.  LAD in the midsegment has tandem 50 to 60% stenosis and apical LAD which bifurcates into diagonal and inferoapical segment has diffuse 80 to 90% stenosis. LCx: Ostium has a 40% stenosis.  Gives origin to high OM1 with a proximal long segment 60% stenosis.  Mid Cx is a 50% stenosis followed by origin of a moderate-sized OM 2 and distal Cx is 85% stenosis with origin of a large OM 3. RCA: Large-caliber vessel, proximal RCA has a 50% stenosis and mid segment has a 50% stenosis.  Mild diffuse disease.  Large PL branch.   Intervention Data: Successful PCI with stenting of the ostial-proximal LAD with a 3.5 x 16 mm Synergy XD DES at 20 atmospheric pressure, stenosis reduced from 100% to 0% with TIMI 0 to TIMI-3 flow.  Occluded apical LAD opened up with dottering with the guidewire.      Impression and Recommendations: Patient may need repeat cardiac catheterization to reevaluate the anatomy and optimize the stent however stent appears well opposed and well-expanded and if he remains stable, we could relook in the outpatient basis.  Aggressive medical therapy recommended.  Hopefully intra-aortic balloon pump can be taken off tomorrow along with Swan-Ganz catheter. _______________  Echocardiogram 12/22/2024: Impressions: 1. Poor acoustic windows.   2. Left ventricular ejection fraction, by estimation, is 65 to 70%. The  left ventricle has normal function. The left ventricle has no regional  wall motion abnormalities. There is moderate asymmetric left ventricular  hypertrophy. Left ventricular  diastolic parameters were normal.   3. Right ventricular systolic function is normal. The right ventricular  size is normal. There is normal pulmonary artery systolic pressure.   4. The mitral valve is normal in structure. Trivial mitral valve  regurgitation.    5. The aortic valve is tricuspid. Aortic valve regurgitation is not  visualized. Aortic valve sclerosis/calcification is present, without any  evidence of aortic stenosis.   6. The inferior vena cava is normal in size with greater than 50%  respiratory variability, suggesting right atrial pressure of 3 mmHg.   EKG:  EKG ordered today. EKG personally reviewed and demonstrates ***.  Recent Labs: 12/21/2024: ALT 32 12/24/2024: BUN 16; Creatinine, Ser 1.13; Hemoglobin 15.6; Magnesium  1.9; Platelets 188; Potassium 3.8; Sodium 139  Recent Lipid Panel    Component Value Date/Time   CHOL 193 12/21/2024 2351   TRIG 247 (H) 12/21/2024 2351   HDL 40 (L) 12/21/2024 2351   CHOLHDL 4.9 12/21/2024 2351   VLDL 49 (H) 12/21/2024 2351   LDLCALC 104 (H) 12/21/2024 2351    Physical Exam:    Vital Signs: There were no vitals taken for this visit.    Wt Readings from Last 3 Encounters:  12/23/24 223 lb 15.8 oz (101.6 kg)  08/04/17 235 lb 8 oz (106.8 kg)     General: 69 y.o. male in no acute distress. HEENT: Normocephalic and atraumatic. Sclera clear.  Neck:  Supple. No carotid bruits. No JVD. Heart: *** RRR. Distinct S1 and S2. No murmurs, gallops, or rubs.  Lungs: No increased work of breathing. Clear to ausculation bilaterally. No wheezes, rhonchi, or rales.  Abdomen: Soft, non-distended, and non-tender to palpation.  Extremities: No lower extremity edema.  Radial and distal pedal pulses 2+ and equal bilaterally. Skin: Warm and dry. Neuro: No focal deficits. Psych: Normal affect. Responds appropriately.   Assessment:    No diagnosis found.  Plan:     Disposition: Follow up in ***   Signed, Stryker Veasey E Milad Bublitz, PA-C  12/24/2024 2:15 PM    [facility] "

## 2024-12-24 NOTE — Hospital Course (Addendum)
 Brett Lamb

## 2024-12-25 ENCOUNTER — Other Ambulatory Visit (HOSPITAL_COMMUNITY): Payer: Self-pay

## 2024-12-25 LAB — LIPOPROTEIN A (LPA): Lipoprotein (a): 73.3 nmol/L — ABNORMAL HIGH

## 2024-12-26 ENCOUNTER — Other Ambulatory Visit (HOSPITAL_COMMUNITY): Payer: Self-pay

## 2024-12-30 NOTE — Progress Notes (Unsigned)
" °  Cardiology Office Note   Date: 12/30/2024  ID:  Brett Lamb 04/28/1956 [id number] PCP: Leonel Cole, MD  [facility] Providers Cardiologist: Gordy Bergamo, MD { Click to update primary MD,subspecialty MD or APP then REFRESH:1}    Chief Complaint: Brett Lamb is a 69 y.o.male with PMH of CAD s/p STEMI with DES to prox LAD 12/2024, HFmrEF with LVEF 45-50% on echo 12/2024, VT arrest, hyperlipidemia, PE, syncope, prostate cancer s/p prostatectomy who presents to the clinic for post-hospital follow-up.    Brett Lamb called EMS 12/21/2024 with chest pain/pressure. On EMS arrival, he was in polymorphic VT. He required two minutes of CPR and was successfully defibrillated. EKG showed anterior STEMI. He was taken to the cath lab, an IABP was placed, and a DES was placed to the culprit lesion in the ostial-proximal LAD. He does have residual severe disease in the distal LAD and LCx and mild disease in the RCA. He required Aggrastat  for 18 hours after the procedure. Echo was read as LVEF 65-70%, but 45-50% with hypokinesis of the distal LV per Dr. Cherrie. IABP successfully removed. He was discharged 12/24/2024 with LifeVest. Medications included aspirin , Brilinta , atorvastatin , carvedilol , Jardiance , losartan , and spironolactone .     History of Present Illness: Today ***  ROS: Denies chest pain, shortness of breath, orthopnea, PND, lower extremity edema, palpitations, lightheadedness, dizziness, syncope, new UTI symptoms, abnormal bleeding. ***  ?meds/cost, UTI sx  CAD: S/p STEMI 12/21/2024 with DES to ostial-proximal LAD. Does have residual disease in distal LAD, LCx, and RCA. Had polymorphic VT on EMS arrival. EKG today *** He denies further anginal symptoms.  - Continue aspirin  81 mg daily and Brilinta  90 mg twice daily - Continue carvedilol  3.125 mg twice daily - Continue atorvastatin  80 mg daily  HFmrEF: Echo read as LVEF 65-70% but 45-50% with hypokinesis of the distal LV per Dr. Cherrie. NYHA  class *** symptoms. He appears euvolemic on exam. - Check CMP today - Continue carvedilol  3.125 mg twice daily - Continue Jardiance  10 mg daily - Continue losartan  12.5 mg daily - Continue spironolactone  25 mg daily - Plan repeat echo in 3 months   Hyperlipidemia, goal LDL < 55: 12/21/2024 LDL 104, HDL 40, TGs 247, total 193, LPA 73. - Check fasting lipid panel in 6 weeks  - Continue atorvastatin  40 mg daily   Studies Reviewed: The following studies were reviewed today: ***      Risk Assessment/Calculations: {Does this patient have ATRIAL FIBRILLATION?:[id number]}  No BP recorded.  {Refresh Note OR Click here to enter BP  :1}***            Physical Exam: VS: There were no vitals taken for this visit. Wt Readings from Last 3 Encounters:  12/23/24 223 lb 15.8 oz (101.6 kg)  08/04/17 235 lb 8 oz (106.8 kg)     GEN: *** Well nourished, in NAD HEENT: Normal NECK: No JVD, no carotid bruits LYMPHATICS: No lymphadenopathy CARDIAC: ***RRR, no murmurs, rubs, gallops RESPIRATORY: Clear to auscultation without rales, wheezing or rhonchi  ABDOMEN: Soft, non-tender, non-distended MUSCULOSKELETAL: No edema, no deformity  SKIN: Warm and dry NEUROLOGIC:  Alert and oriented x 3 PSYCHIATRIC:  Normal affect   Assessment & Plan: ***     {Are you ordering a CV Procedure (e.g. stress test, cath, DCCV, TEE, etc)?   Press F2        :[id number]}   Dispo: ***  Signed, Saddie GORMAN Cleaves, NP 12/30/2024 10:06 PM [facility] "

## 2025-01-01 ENCOUNTER — Telehealth: Payer: Self-pay | Admitting: Cardiology

## 2025-01-01 ENCOUNTER — Ambulatory Visit: Admitting: Student

## 2025-01-01 NOTE — Telephone Encounter (Signed)
 Pt requesting a c/b regarding sooner appt than rescheduled for due to needing our approval to start Rehab. Please advise

## 2025-01-02 NOTE — Progress Notes (Unsigned)
 " Cardiology Office Note   Date:  01/03/2025  ID:  Brett Lamb, DOB December 22, 1955, MRN [id number] PCP: Leonel Cole, MD  [facility] Providers Cardiologist:  Gordy Bergamo, MD    History of Present Illness Brett Lamb is a 69 y.o. 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   Patient was first seen by Cardiology during recent hospitalization. He was admitted from 1/15 to 12/24/2024 for a STEMI and cardiac arrest. Patient called EMS for chest pain and when EMS was arrived he was noted to be in VT. He then went into cardiac arrest. CPR was started and patient was defibrillated back into sinus rhythm. He was started on Amiodarone en route to the hospital. High-sensitivity troponin peaked at **. Emergent cardiac catheterization showed 30% stenosis of left main, occluded proximal LAD with tandem 50-60% stenosis of mid LAD and 80-90% stenosis of distal LAD, mild to moderate disease of proximal to mid LCX with 85% stenosis of distal LCX, and moderate disease of RCA.  RHC showed relatively stable pressures. IABP was placed and patient underwent successful PCI with DES to the ostial-proximal LAD. Occluded apical LAD opened up with dottering with the guidewire. He was treated with Aggrastat  for 18 hours and started on DAPT as well as a high-intensity statin.  Echo report read LVEF as 65-70%. However, per Dr. Nelle read, LVEF 45-50% with akinesis of the LV apex. He was started on GDMT. He was discharged on Aspirin , Brilinta , Lipitor, Losartan , Coreg , Spironolactone , and Jardiance . He was discharge with Life Vest given he presented with VT and was noted to have PVCs after cath.  Patient presents today for follow-up.  Tells me that he has been  doing well since getting out of the hospital.  He has been able to walk for 45 minutes/day without difficulty.  He denies chest pain or shortness of breath.  Denies syncope or near syncope.  He has been compliant with his medications.  Denies bleeding or bruising on DAPT.  He has been wearing his LifeVest and has not been shocked.  His right radial and right groin cath sites have healed well.  He is interested in participating in cardiac rehab.  He has been tracking his weights daily and has not had any weight gain.  No ankle edema, abdominal distention, orthopnea, shortness of breath  Studies Reviewed EKG Interpretation Date/Time:  Wednesday January 03 2025 15:27:52 EST Ventricular Rate:  75 PR Interval:  140 QRS Duration:  92 QT Interval:  360 QTC Calculation: 402 R Axis:   -21  Text Interpretation: Normal sinus rhythm Cannot rule out Anterior infarct , age undetermined When compared with ECG of 22-Dec-2024 06:52, T wave inversion now evident in Anterior leads Confirmed by Vicci Sauer [phone]) on 01/03/2025 4:16:37 PM   Cardiac Studies & Procedures   ______________________________________________________________________________________________ CARDIAC CATHETERIZATION  CARDIAC CATHETERIZATION 12/22/2024  Conclusion Images from the original result were not included. Cardiac Catheterization 12/22/24: Hemodynamic data: RA 8/6, mean 4 mmHg RV 24/14, EDP 11 mmHg PA 22/16, mean 13 mmHg PW 14/13, mean 12 mmHg. QP/QS 1.0.  CO 4.55, CI 2.01 by Fick.  Papi 1.5.  LV 119/7, EDP 17 mmHg.  AO 111/68, mean 86 mmHg.  There is no pressure gradient across  the aortic valve.  Angiographic data: LM: Distal left main has a 30% stenosis. LAD: Occluded in the proximal segment.  There is an ulcerated lesion in the proximal LAD.  Gives origin to large D1 which is mild disease.  LAD in the midsegment has tandem 50 to 60% stenosis and apical LAD which bifurcates into diagonal and inferoapical segment has  diffuse 80 to 90% stenosis. LCx: Ostium has a 40% stenosis.  Gives origin to high OM1 with a proximal long segment 60% stenosis.  Mid Cx is a 50% stenosis followed by origin of a moderate-sized OM 2 and distal Cx is 85% stenosis with origin of a large OM 3. RCA: Large-caliber vessel, proximal RCA has a 50% stenosis and mid segment has a 50% stenosis.  Mild diffuse disease.  Large PL branch.  Intervention data: Successful PCI with stenting of the ostial-proximal LAD with a 3.5 x 16 mm Synergy XD DES at 20 atmospheric pressure, stenosis reduced from 100% to 0% with TIMI 0 to TIMI-3 flow.  Occluded apical LAD opened up with dottering with the guidewire.    Impression and recommendations: Patient may need repeat cardiac catheterization to reevaluate the anatomy and optimize the stent however stent appears well opposed and well-expanded and if he remains stable, we could relook in the outpatient basis.  Aggressive medical therapy recommended.  Hopefully intra-aortic balloon pump can be taken off tomorrow along with Swan-Ganz catheter.  Findings Coronary Findings Diagnostic  Dominance: Right  Left Main Mid LM to Dist LM lesion is 30% stenosed.  Left Anterior Descending Ost LAD to Prox LAD lesion is 100% stenosed. Vessel is the culprit lesion. The lesion is type C, thrombotic and ulcerative. Mid LAD-1 lesion is 60% stenosed. Mid LAD-2 lesion is 50% stenosed. Mid LAD to Dist LAD lesion is 60% stenosed. Dist LAD lesion is 90% stenosed.  Left Circumflex Ost Cx lesion is 40% stenosed. Mid Cx lesion is 50% stenosed. Dist Cx lesion is 85% stenosed.  First Obtuse Marginal Branch 1st Mrg lesion is 60% stenosed.  Right Coronary Artery Prox RCA lesion is 50% stenosed. Mid RCA to Dist RCA lesion is 50% stenosed.  Intervention  Ost LAD to Prox LAD lesion Stent Lesion length:  14 mm. CATH VISTA GUIDE 6FR XBLD 3.5 guide catheter was inserted. Lesion crossed with guidewire using a WIRE ASAHI  PROWATER 180CM. Pre-stent angioplasty was performed using a BALLOON EMERGE MR 2.5X12. Maximum pressure:  16 atm. Inflation time:  30 sec. A drug-eluting stent was successfully placed using a STENT SYNERGY XD 3.50X16. Maximum pressure: 20 atm. Inflation time: 40 sec. Stent strut is well apposed. Post-stent angioplasty was not performed. Post-Intervention Lesion Assessment The intervention was successful. Pre-interventional TIMI flow is 0. Post-intervention TIMI flow is 3. Treated lesion length:  16 mm. No complications occurred at this lesion. There is a 0% residual stenosis post intervention.     ECHOCARDIOGRAM  ECHOCARDIOGRAM COMPLETE 12/22/2024  Narrative ECHOCARDIOGRAM REPORT    Patient Name:   Brett Lamb Date of Exam: 12/22/2024 Medical Rec #:  [id number]   Height:       73.0 in Accession #:    [id number]  Weight:       230.6 lb Date of Birth:  [birth year]   BSA:          2.286 m Patient Age:    68 years    BP:           129/52 mmHg Patient Gender: M  HR:           65 bpm. Exam Location:  Inpatient  Procedure: 2D Echo, Cardiac Doppler, Color Doppler and Intracardiac Opacification Agent (Both Spectral and Color Flow Doppler were utilized during procedure).  Indications:    CAD I25.10  History:        Patient has no prior history of Echocardiogram examinations. Previous Myocardial Infarction and CAD.  Sonographer:    Koleen Popper RDCS Referring Phys: BECKEY LITTIE COE   Sonographer Comments: Image acquisition challenging due to patient body habitus. IMPRESSIONS   1. Poor acoustic windows. 2. Left ventricular ejection fraction, by estimation, is 65 to 70%. The left ventricle has normal function. The left ventricle has no regional wall motion abnormalities. There is moderate asymmetric left ventricular hypertrophy. Left ventricular diastolic parameters were normal. 3. Right ventricular systolic function is normal. The right ventricular size is normal. There is normal  pulmonary artery systolic pressure. 4. The mitral valve is normal in structure. Trivial mitral valve regurgitation. 5. The aortic valve is tricuspid. Aortic valve regurgitation is not visualized. Aortic valve sclerosis/calcification is present, without any evidence of aortic stenosis. 6. The inferior vena cava is normal in size with greater than 50% respiratory variability, suggesting right atrial pressure of 3 mmHg.  FINDINGS Left Ventricle: Left ventricular ejection fraction, by estimation, is 65 to 70%. The left ventricle has normal function. The left ventricle has no regional wall motion abnormalities. Definity  contrast agent was given IV to delineate the left ventricular endocardial borders. The left ventricular internal cavity size was normal in size. There is moderate asymmetric left ventricular hypertrophy. Left ventricular diastolic parameters were normal.  Right Ventricle: The right ventricular size is normal. Right vetricular wall thickness was not assessed. Right ventricular systolic function is normal. There is normal pulmonary artery systolic pressure. The tricuspid regurgitant velocity is 2.07 m/s, and with an assumed right atrial pressure of 3 mmHg, the estimated right ventricular systolic pressure is 20.1 mmHg.  Left Atrium: Left atrial size was normal in size.  Right Atrium: Right atrial size was normal in size.  Pericardium: There is no evidence of pericardial effusion.  Mitral Valve: The mitral valve is normal in structure. Trivial mitral valve regurgitation.  Tricuspid Valve: The tricuspid valve is not well visualized. Tricuspid valve regurgitation is trivial.  Aortic Valve: The aortic valve is tricuspid. Aortic valve regurgitation is not visualized. Aortic valve sclerosis/calcification is present, without any evidence of aortic stenosis.  Pulmonic Valve: The pulmonic valve was not well visualized. Pulmonic valve regurgitation is not visualized. No evidence of pulmonic  stenosis.  Aorta: The aortic root is normal in size and structure. Ascending aorta measurements are within normal limits for age when indexed to body surface area.  Venous: The inferior vena cava is normal in size with greater than 50% respiratory variability, suggesting right atrial pressure of 3 mmHg.  IAS/Shunts: No atrial level shunt detected by color flow Doppler.   LEFT VENTRICLE PLAX 2D LVIDd:         4.50 cm      Diastology LVIDs:         2.70 cm      LV e' medial:    7.72 cm/s LV PW:         1.20 cm      LV E/e' medial:  12.0 LV IVS:        1.56 cm      LV e' lateral:   13.70 cm/s LVOT diam:  2.50 cm      LV E/e' lateral: 6.8 LV SV:         127 LV SV Index:   56 LVOT Area:     4.91 cm  LV Volumes (MOD) LV vol d, MOD A2C: 107.0 ml LV vol s, MOD A2C: 31.3 ml LV SV MOD A2C:     75.7 ml  RIGHT VENTRICLE             IVC RV S prime:     17.50 cm/s  IVC diam: 1.90 cm TAPSE (M-mode): 1.8 cm  LEFT ATRIUM             Index LA diam:        3.80 cm 1.66 cm/m LA Vol (A2C):   36.1 ml 15.79 ml/m LA Vol (A4C):   40.4 ml 17.67 ml/m LA Biplane Vol: 40.9 ml 17.89 ml/m AORTIC VALVE LVOT Vmax:   119.00 cm/s LVOT Vmean:  82.300 cm/s LVOT VTI:    0.259 m  AORTA Ao Root diam: 3.60 cm Ao Asc diam:  4.10 cm  MITRAL VALVE               TRICUSPID VALVE MV Area (PHT): 3.60 cm    TR Peak grad:   17.1 mmHg MV Decel Time: 211 msec    TR Vmax:        207.00 cm/s MV E velocity: 92.80 cm/s MV A velocity: 70.70 cm/s  SHUNTS MV E/A ratio:  1.31        Systemic VTI:  0.26 m Systemic Diam: 2.50 cm  Vina Gull MD Electronically signed by Vina Gull MD Signature Date/Time: 12/22/2024/1:29:27 PM    Final          ______________________________________________________________________________________________      Risk Assessment/Calculations          Physical Exam VS:  BP 120/82   Pulse 75   Ht 6' 2 (1.88 m)   Wt 229 lb 12.8 oz (104.2 kg)   SpO2 99%   BMI 29.50  kg/m        Wt Readings from Last 3 Encounters:  01/03/25 229 lb 12.8 oz (104.2 kg)  12/23/24 223 lb 15.8 oz (101.6 kg)  08/04/17 235 lb 8 oz (106.8 kg)    GEN: Well nourished, well developed in no acute distress. Sitting comfortably on the exam table  NECK: No JVD; No carotid bruits CARDIAC:  RRR, no murmurs, rubs, gallops. Radial pulses 2+ bilaterally. Right radial cath site and right groin cath site are healed  RESPIRATORY:  Clear to auscultation without rales, wheezing or rhonchi. Normal WOB on room air  ABDOMEN: Soft, non-tender, non-distended EXTREMITIES:  No edema in BLE; No deformity   ASSESSMENT AND PLAN  CAD with Recent STEMI Cardiac Arrest Secondary to VT - Patient was admitted earlier this month with STEMI and cardiac arrest secondary to VT. Emergent LHC showed occluded ostial to proximal LAD lesion as well as severe distal LAD disease, severe distal LCX disease, and otherwise moderate disease. IABP was placed and he underwent successful PCI with DES to ostial-proximal LAD. He was treated with Aggrastat  for 18 hours following cath. - Doing well.  Denies chest pain, shortness of breath, palpitations/syncope/near syncope.  He has been walking about 45 minutes/day and tolerating it well - Right radial and right groin cath sites have healed appropriately - Continue Coreg  3.125mg  twice dialy.  - Continue DAPT with Aspirin  81mg  daily and Brilinta  90mg  twice daily.  Denies bleeding or bruising  on DAPT - Continue Lipitor 80mg  daily. - Patient has been compliant with LifeVest.  He did mention that prior to discharge, one of the doctors mentioned he may only have to wear it for 2 weeks.  Appears Dr. Cherrie was rounding on him.  I will message both him and Dr. Ladona for further recommendations.  For now, continue to wear LifeVest for 3 months  Ischemic Cardiomyopathy Chronic HFmrEF - Echo report during recent admission for STEMI/ cardiac arrest read EF as 65-70%. However, Dr.  Nelle read, LVEF 45-50% with akinesis of the LV apex.  - Euvolemic on exam.  Weight has been downtrending/stable.  No shortness of breath, orthopnea, lower extremity swelling - Continue Losartan  12.5mg  daily.  - Continue Coreg  3.125mg  twice daily.  - Continue Spironolactone  12.5mg  daily.  - Continue Jardiance  10mg  daily.  - Will repeat BMET today - Plan to repeat echo in about 3 months to reassess EF  Hyperlipidemia - Lipid panel on 12/21/2024: Total Cholesterol 193, Triglycerides 247, HDL 40, LDL 105. LDL goal <55. - Continue Lipitor 80mg  daily (started during recent admission). - Will repeat lipid panel and LFTs in 6-8 weeks.    History of PE Diagnosed in 2018. Completed course of anticoagulation.     Cardiac Rehabilitation Eligibility Assessment  The patient is ready to start cardiac rehabilitation from a cardiac standpoint.       Dispo: Follow up with Dr. Ladona in 6-8 weeks   Signed, Rollo FABIENE Louder, PA-C   "

## 2025-01-02 NOTE — Telephone Encounter (Signed)
 Called pt, sooner appt made. Pt thankful for the change.

## 2025-01-03 ENCOUNTER — Ambulatory Visit: Attending: Internal Medicine | Admitting: Cardiology

## 2025-01-03 ENCOUNTER — Encounter: Payer: Self-pay | Admitting: Cardiology

## 2025-01-03 VITALS — BP 120/82 | HR 75 | Ht 74.0 in | Wt 229.8 lb

## 2025-01-03 DIAGNOSIS — I255 Ischemic cardiomyopathy: Secondary | ICD-10-CM | POA: Diagnosis not present

## 2025-01-03 DIAGNOSIS — I2109 ST elevation (STEMI) myocardial infarction involving other coronary artery of anterior wall: Secondary | ICD-10-CM | POA: Insufficient documentation

## 2025-01-03 DIAGNOSIS — I469 Cardiac arrest, cause unspecified: Secondary | ICD-10-CM | POA: Insufficient documentation

## 2025-01-03 DIAGNOSIS — E7800 Pure hypercholesterolemia, unspecified: Secondary | ICD-10-CM | POA: Insufficient documentation

## 2025-01-03 DIAGNOSIS — I5020 Unspecified systolic (congestive) heart failure: Secondary | ICD-10-CM | POA: Insufficient documentation

## 2025-01-03 NOTE — Patient Instructions (Signed)
 Medication Instructions:  Your physician recommends that you continue on your current medications as directed. Please refer to the Current Medication list given to you today.  *If you need a refill on your cardiac medications before your next appointment, please call your pharmacy*  Lab Work: TODAY:  BMET  If you have labs (blood work) drawn today and your tests are completely normal, you will receive your results only by: MyChart Message (if you have MyChart) OR A paper copy in the mail If you have any lab test that is abnormal or we need to change your treatment, we will call you to review the results.  Testing/Procedures: None ordered  Follow-Up: At [facility], you and your health needs are our priority.  As part of our continuing mission to provide you with exceptional heart care, our providers are all part of one team.  This team includes your primary Cardiologist (physician) and Advanced Practice Providers or APPs (Physician Assistants and Nurse Practitioners) who all work together to provide you with the care you need, when you need it.  Your next appointment:   6 -8 week(s)  Provider:   Gordy Bergamo, MD    We recommend signing up for the patient portal called MyChart.  Sign up information is provided on this After Visit Summary.  MyChart is used to connect with patients for Virtual Visits (Telemedicine).  Patients are able to view lab/test results, encounter notes, upcoming appointments, etc.  Non-urgent messages can be sent to your provider as well.   To learn more about what you can do with MyChart, go to [email or web address].   Other Instructions

## 2025-01-04 LAB — BASIC METABOLIC PANEL WITH GFR
BUN/Creatinine Ratio: 15 (ref 10–24)
BUN: 23 mg/dL (ref 8–27)
CO2: 19 mmol/L — ABNORMAL LOW (ref 20–29)
Calcium: 9.9 mg/dL (ref 8.6–10.2)
Chloride: 99 mmol/L (ref 96–106)
Creatinine, Ser: 1.54 mg/dL — ABNORMAL HIGH (ref 0.76–1.27)
Glucose: 104 mg/dL — ABNORMAL HIGH (ref 70–99)
Potassium: 4.2 mmol/L (ref 3.5–5.2)
Sodium: 136 mmol/L (ref 134–144)
eGFR: 48 mL/min/{1.73_m2} — ABNORMAL LOW

## 2025-01-05 ENCOUNTER — Ambulatory Visit: Payer: Self-pay | Admitting: Cardiology

## 2025-01-05 ENCOUNTER — Encounter (HOSPITAL_COMMUNITY): Payer: Self-pay

## 2025-01-05 ENCOUNTER — Telehealth (HOSPITAL_COMMUNITY): Payer: Self-pay

## 2025-01-05 DIAGNOSIS — Z79899 Other long term (current) drug therapy: Secondary | ICD-10-CM

## 2025-01-05 NOTE — Telephone Encounter (Signed)
 Pt insurance is active and benefits verified through Medicare a/b Co-pay 0, DED $283/$172.70 met, out of pocket 0/0 met, co-insurance 20%. no pre-authorization required. Passport, 01/05/2025@3 :15, REF# [id number]  2ndary insurance is active and benefits verified through [employer]. Co-pay 0, DED 0/0 met, out of pocket 0/0 met, co-insurance 0%. No pre-authorization required.  TCR/ICR? ICR Visit(date of service)limitation? 36 Can multiple codes be used on the same date of service/visit?(IF ITS A LIMIT) yes  Is this a lifetime maximum or an annual maximum? annual Has the member used any of these services to date? no Is there a time limit (weeks/months) on start of program and/or program completion? no

## 2025-01-05 NOTE — Telephone Encounter (Signed)
 Pt called wanting to schedule for cardiac rehab. Pt will come in for orientation on 2/24@1030  and will attend the 10:15 exercise class time.   Sent packet

## 2025-01-09 ENCOUNTER — Telehealth (HOSPITAL_COMMUNITY): Payer: Self-pay

## 2025-01-09 DIAGNOSIS — Z79899 Other long term (current) drug therapy: Secondary | ICD-10-CM

## 2025-01-09 MED ORDER — SPIRONOLACTONE 25 MG PO TABS: 12.5000 mg | ORAL_TABLET | ORAL | Status: AC

## 2025-01-19 ENCOUNTER — Ambulatory Visit: Admitting: Nurse Practitioner

## 2025-01-30 ENCOUNTER — Encounter (HOSPITAL_COMMUNITY)

## 2025-02-05 ENCOUNTER — Encounter (HOSPITAL_COMMUNITY)

## 2025-02-07 ENCOUNTER — Encounter (HOSPITAL_COMMUNITY)

## 2025-02-09 ENCOUNTER — Encounter (HOSPITAL_COMMUNITY)

## 2025-02-12 ENCOUNTER — Encounter (HOSPITAL_COMMUNITY)

## 2025-02-14 ENCOUNTER — Encounter (HOSPITAL_COMMUNITY)

## 2025-02-16 ENCOUNTER — Encounter (HOSPITAL_COMMUNITY)

## 2025-02-19 ENCOUNTER — Encounter (HOSPITAL_COMMUNITY)

## 2025-02-21 ENCOUNTER — Ambulatory Visit: Admitting: Cardiology

## 2025-02-21 ENCOUNTER — Encounter (HOSPITAL_COMMUNITY)

## 2025-02-23 ENCOUNTER — Encounter (HOSPITAL_COMMUNITY)

## 2025-02-26 ENCOUNTER — Encounter (HOSPITAL_COMMUNITY)

## 2025-02-28 ENCOUNTER — Encounter (HOSPITAL_COMMUNITY)

## 2025-03-02 ENCOUNTER — Encounter (HOSPITAL_COMMUNITY)

## 2025-03-05 ENCOUNTER — Encounter (HOSPITAL_COMMUNITY)

## 2025-03-07 ENCOUNTER — Encounter (HOSPITAL_COMMUNITY)

## 2025-03-09 ENCOUNTER — Encounter (HOSPITAL_COMMUNITY)

## 2025-03-12 ENCOUNTER — Encounter (HOSPITAL_COMMUNITY)

## 2025-03-14 ENCOUNTER — Encounter (HOSPITAL_COMMUNITY)

## 2025-03-16 ENCOUNTER — Encounter (HOSPITAL_COMMUNITY)

## 2025-03-19 ENCOUNTER — Encounter (HOSPITAL_COMMUNITY)

## 2025-03-21 ENCOUNTER — Encounter (HOSPITAL_COMMUNITY)

## 2025-03-23 ENCOUNTER — Encounter (HOSPITAL_COMMUNITY)

## 2025-03-26 ENCOUNTER — Encounter (HOSPITAL_COMMUNITY)

## 2025-03-28 ENCOUNTER — Encounter (HOSPITAL_COMMUNITY)

## 2025-03-30 ENCOUNTER — Encounter (HOSPITAL_COMMUNITY)

## 2025-04-02 ENCOUNTER — Encounter (HOSPITAL_COMMUNITY)

## 2025-04-04 ENCOUNTER — Encounter (HOSPITAL_COMMUNITY)

## 2025-04-06 ENCOUNTER — Encounter (HOSPITAL_COMMUNITY)

## 2025-04-09 ENCOUNTER — Encounter (HOSPITAL_COMMUNITY)

## 2025-04-11 ENCOUNTER — Encounter (HOSPITAL_COMMUNITY)

## 2025-04-13 ENCOUNTER — Encounter (HOSPITAL_COMMUNITY)

## 2025-04-16 ENCOUNTER — Encounter (HOSPITAL_COMMUNITY)

## 2025-04-18 ENCOUNTER — Encounter (HOSPITAL_COMMUNITY)

## 2025-04-20 ENCOUNTER — Encounter (HOSPITAL_COMMUNITY)

## 2025-04-23 ENCOUNTER — Encounter (HOSPITAL_COMMUNITY)

## 2025-04-25 ENCOUNTER — Encounter (HOSPITAL_COMMUNITY)
